# Patient Record
Sex: Female | Born: 1995 | Race: White | Hispanic: No | Marital: Single | Smoking: Former smoker
Health system: Southern US, Community
[De-identification: ages and names within clinical notes are randomized; demographics above are authoritative.]

## PROBLEM LIST (undated history)

## (undated) DIAGNOSIS — O24419 Gestational diabetes mellitus in pregnancy, unspecified control: Secondary | ICD-10-CM

## (undated) DIAGNOSIS — A609 Anogenital herpesviral infection, unspecified: Secondary | ICD-10-CM

## (undated) HISTORY — PX: NO PAST SURGERIES: SHX2092

---

## 2016-07-04 ENCOUNTER — Encounter (HOSPITAL_COMMUNITY): Payer: Self-pay | Admitting: Emergency Medicine

## 2016-07-04 ENCOUNTER — Emergency Department (HOSPITAL_COMMUNITY): Payer: Self-pay

## 2016-07-04 ENCOUNTER — Emergency Department (HOSPITAL_COMMUNITY)
Admission: EM | Admit: 2016-07-04 | Discharge: 2016-07-04 | Disposition: A | Discharge status: Home / Self Care | Payer: Self-pay | Attending: Emergency Medicine | Admitting: Emergency Medicine

## 2016-07-04 DIAGNOSIS — Y929 Unspecified place or not applicable: Secondary | ICD-10-CM | POA: Insufficient documentation

## 2016-07-04 DIAGNOSIS — Y999 Unspecified external cause status: Secondary | ICD-10-CM | POA: Insufficient documentation

## 2016-07-04 DIAGNOSIS — S93401A Sprain of unspecified ligament of right ankle, initial encounter: Secondary | ICD-10-CM | POA: Insufficient documentation

## 2016-07-04 DIAGNOSIS — W109XXA Fall (on) (from) unspecified stairs and steps, initial encounter: Secondary | ICD-10-CM | POA: Insufficient documentation

## 2016-07-04 DIAGNOSIS — F17200 Nicotine dependence, unspecified, uncomplicated: Secondary | ICD-10-CM | POA: Insufficient documentation

## 2016-07-04 DIAGNOSIS — Y9301 Activity, walking, marching and hiking: Secondary | ICD-10-CM | POA: Insufficient documentation

## 2016-07-04 MED ORDER — NAPROXEN 250 MG PO TABS
500.0000 mg | ORAL_TABLET | Freq: Once | ORAL | Status: AC
  Administered 2016-07-04: 500 mg via ORAL
  Filled 2016-07-04: qty 2

## 2016-07-04 NOTE — Discharge Instructions (Signed)
Please continue to use the Ace bandage and crutches given to here in ED as needed. Weightbearing as tolerated. Please use ibuprofen or naproxen as needed for pain and swelling. Please use rest, ice, compression, elevation at home. Please follow-up with Dr. Rayburn Ma at [facility], in 1-2 weeks as needed or if symptoms worsen.   Contact a health care provider if: You have rapidly increasing bruising or swelling. Your pain is not relieved with medicine. Get help right away if: Your toes or foot becomes numb or blue. You have severe pain that gets worse.

## 2016-07-04 NOTE — ED Provider Notes (Signed)
[facility]-EMERGENCY DEPT Provider Note   CSN: [id number] Arrival date & time: 07/04/16  2048  By signing my name below, I, Nelwyn Salisbury, attest that this documentation has been prepared under the direction and in the presence of non-physician practitioner, Candie Mile, PA-C. Electronically Signed: Nelwyn Salisbury, Scribe. 07/04/2016. 10:08 PM.  History   Chief Complaint Chief Complaint  Patient presents with  . Foot Pain   The history is provided by the patient. No language interpreter was used.   HPI Comments:  Rebecca Rangel is an otherwise healthy 21 y.o. female who presents to the Emergency Department complaining of constant, unchanged right ankle pain after falling last night. She describes her symptoms as a 9/10 aching pain exacerbated with weight-bearing. Pt states that she was walking down some stairs when her ankle inverted. She reports associated swelling to the area. Pt has not tried any medications PTA for her symptoms. Denies any headache, nausea, vomiting or fever.    History reviewed. No pertinent past medical history.  There are no active problems to display for this patient.   History reviewed. No pertinent surgical history.  OB History    No data available       Home Medications    Prior to Admission medications   Not on File    Family History History reviewed. No pertinent family history.  Social History Social History  Substance Use Topics  . Smoking status: Current Every Day Smoker    Packs/day: 0.25  . Smokeless tobacco: Never Used  . Alcohol use Yes     Comment: socially     Allergies   Patient has no allergy information on record.   Review of Systems Review of Systems  Constitutional: Negative for fever.  Gastrointestinal: Negative for nausea and vomiting.  Musculoskeletal: Positive for arthralgias and joint swelling.  Neurological: Negative for headaches.     Physical Exam Updated Vital Signs BP 94/82 (BP Location: Right Arm)    Pulse (!) 102   Temp 98.9 F (37.2 C) (Oral)   Resp 18   Ht  (1.651 m)   LMP 06/03/2016   SpO2 99%   Physical Exam  Constitutional: She appears well-developed and well-nourished.  Well appearing  HENT:  Head: Normocephalic and atraumatic.  Nose: Nose normal.  Eyes: Conjunctivae and EOM are normal.  Neck: Normal range of motion.  Cardiovascular: Normal rate and intact distal pulses.   Pulmonary/Chest: Effort normal. No respiratory distress.  Normal work of breathing. No respiratory distress noted.   Abdominal: Soft.  Musculoskeletal: Normal range of motion. She exhibits tenderness.  DP pulses 2+ bilaterally. Good strength and sensation distally. Mild swelling to lateral malleolus of right foot. No redness or bruising. Mildly tender to palpation.  Neurological: She is alert.  Skin: Skin is warm.  Psychiatric: She has a normal mood and affect. Her behavior is normal.  Nursing note and vitals reviewed.    ED Treatments / Results  DIAGNOSTIC STUDIES:  Oxygen Saturation is 99% on RA, normal by my interpretation.    COORDINATION OF CARE:  11:03 PM Discussed treatment plan with pt at bedside and pt agreed to plan.  Labs (all labs ordered are listed, but only abnormal results are displayed) Labs Reviewed - No data to display  EKG  EKG Interpretation None       Radiology Dg Ankle Complete Right  Result Date: 07/04/2016 CLINICAL DATA:  Fall with ankle pain. EXAM: RIGHT ANKLE - COMPLETE 3+ VIEW COMPARISON:  None. FINDINGS: There is no  evidence of fracture, dislocation, or joint effusion. There is no evidence of arthropathy or other focal bone abnormality. Moderate soft tissue swelling. IMPRESSION: No fracture or dislocation of the right ankle. Electronically Signed   By: Deatra Robinson M.D.   On: 07/04/2016 22:05    Procedures Procedures (including critical care time)  Medications Ordered in ED Medications  naproxen (NAPROSYN) tablet 500 mg (not administered)      Initial Impression / Assessment and Plan / ED Course  I have reviewed the triage vital signs and the nursing notes.  Pertinent labs & imaging results that were available during my care of the patient were reviewed by me and considered in my medical decision making (see chart for details).    Patient X-Ray negative for obvious fracture or dislocation. Pain managed in ED. Pt advised to follow up with orthopedics if symptoms persist for possibility of missed fracture diagnosis in 1-2 weeks. Patient given ace bandage, crutches, and naproxen while in ED, conservative therapy recommended and discussed. Patient in NAD, hemodynamically stable, afebrile. Patient will be dc home & is agreeable with above plan. Return precautions given.   Final Clinical Impressions(s) / ED Diagnoses   Final diagnoses:  Sprain of right ankle, unspecified ligament, initial encounter    New Prescriptions New Prescriptions   No medications on file  I personally performed the services described in this documentation, which was scribed in my presence. The recorded information has been reviewed and is accurate.    [address] [place], [place] 07/04/16 2305    Jacalyn Lefevre, MD 07/04/16 [phone]

## 2016-07-04 NOTE — ED Triage Notes (Signed)
Pt presents to ED for assessment of right foot pain after twisting it coming down steps.  Pt c/o pain radiating up from foot to knee.

## 2016-07-04 NOTE — ED Notes (Signed)
ED Provider at bedside. 

## 2016-07-04 NOTE — Progress Notes (Signed)
Orthopedic Tech Progress Note Patient Details:  Rebecca Rangel 1995-09-11 [id number]  Ortho Devices Type of Ortho Device: Ace wrap, Crutches Ortho Device/Splint Location: RLE Ortho Device/Splint Interventions: Ordered, Application   Jennye Moccasin 07/04/2016, 11:07 PM

## 2018-01-06 LAB — OB RESULTS CONSOLE ABO/RH: RH Type: NEGATIVE

## 2018-01-06 LAB — OB RESULTS CONSOLE GC/CHLAMYDIA
Chlamydia: NEGATIVE
Gonorrhea: NEGATIVE

## 2018-01-06 LAB — OB RESULTS CONSOLE HIV ANTIBODY (ROUTINE TESTING): HIV: NONREACTIVE

## 2018-01-06 LAB — OB RESULTS CONSOLE HEPATITIS B SURFACE ANTIGEN: Hepatitis B Surface Ag: NEGATIVE

## 2018-01-06 LAB — OB RESULTS CONSOLE RUBELLA ANTIBODY, IGM: Rubella: IMMUNE

## 2018-01-06 LAB — OB RESULTS CONSOLE RPR: RPR: NONREACTIVE

## 2018-01-06 LAB — OB RESULTS CONSOLE ANTIBODY SCREEN: Antibody Screen: NEGATIVE

## 2018-06-08 ENCOUNTER — Other Ambulatory Visit: Payer: Self-pay

## 2018-06-08 ENCOUNTER — Encounter: Payer: Self-pay | Attending: Obstetrics and Gynecology | Admitting: Registered"

## 2018-06-08 DIAGNOSIS — O99810 Abnormal glucose complicating pregnancy: Secondary | ICD-10-CM | POA: Insufficient documentation

## 2018-06-10 ENCOUNTER — Encounter: Payer: Self-pay | Admitting: Registered"

## 2018-06-10 DIAGNOSIS — O99810 Abnormal glucose complicating pregnancy: Secondary | ICD-10-CM | POA: Insufficient documentation

## 2018-06-10 NOTE — Progress Notes (Signed)
Patient was seen on 06/08/18 for Gestational Diabetes self-management class at the [facility]. The following learning objectives were met by the patient during this course:   States the definition of Gestational Diabetes  States why dietary management is important in controlling blood glucose  Describes the effects each nutrient has on blood glucose levels  Demonstrates ability to create a balanced meal plan  Demonstrates carbohydrate counting   States when to check blood glucose levels  Demonstrates proper blood glucose monitoring techniques  States the effect of stress and exercise on blood glucose levels  States the importance of limiting caffeine and abstaining from alcohol and smoking  Blood glucose monitor given: [employer] Lot # [id number] x Exp: 11/21/18 Blood glucose reading: 85 mg/dL  Patient instructed to monitor glucose levels: FBS: 60 - <95; 1 hour: <140; 2 hour: <120  Patient received handouts:  Nutrition Diabetes and Pregnancy, including carb counting list  Patient will be seen for follow-up as needed.

## 2018-07-04 IMAGING — CR DG ANKLE COMPLETE 3+V*R*
3 series · 3 of 3 positions shown · non-contrast
Comparison: None.

CLINICAL DATA: Fall with ankle pain.

EXAM:
RIGHT ANKLE - COMPLETE 3+ VIEW

[ankle ap]
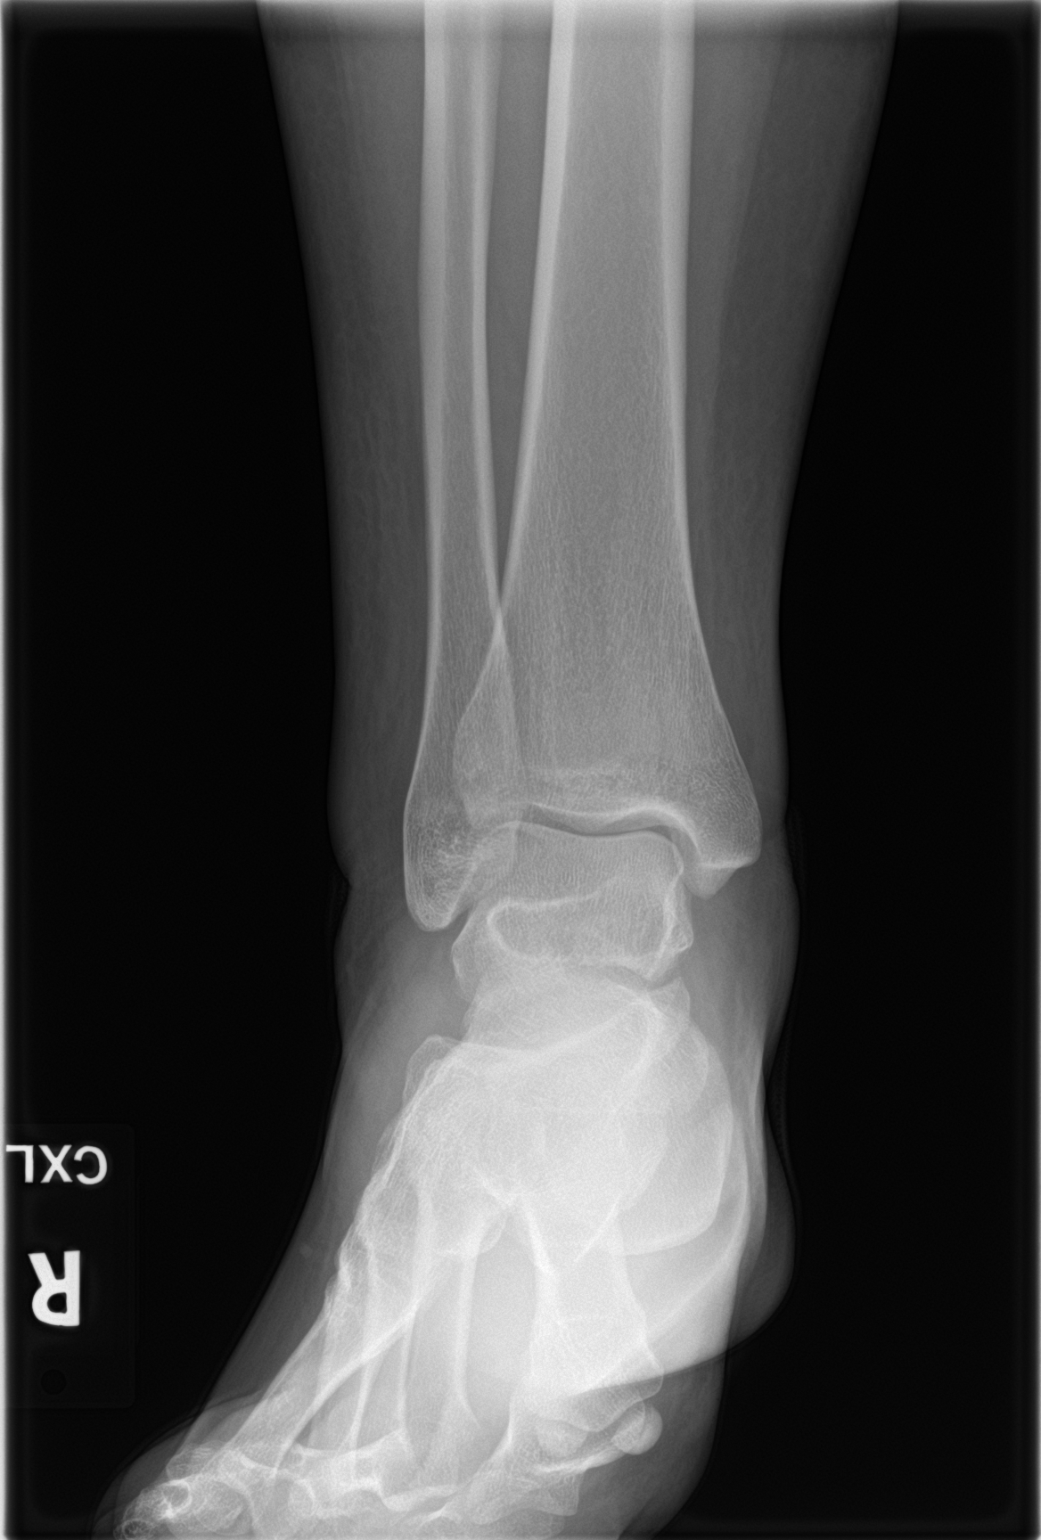

[ankle obl]
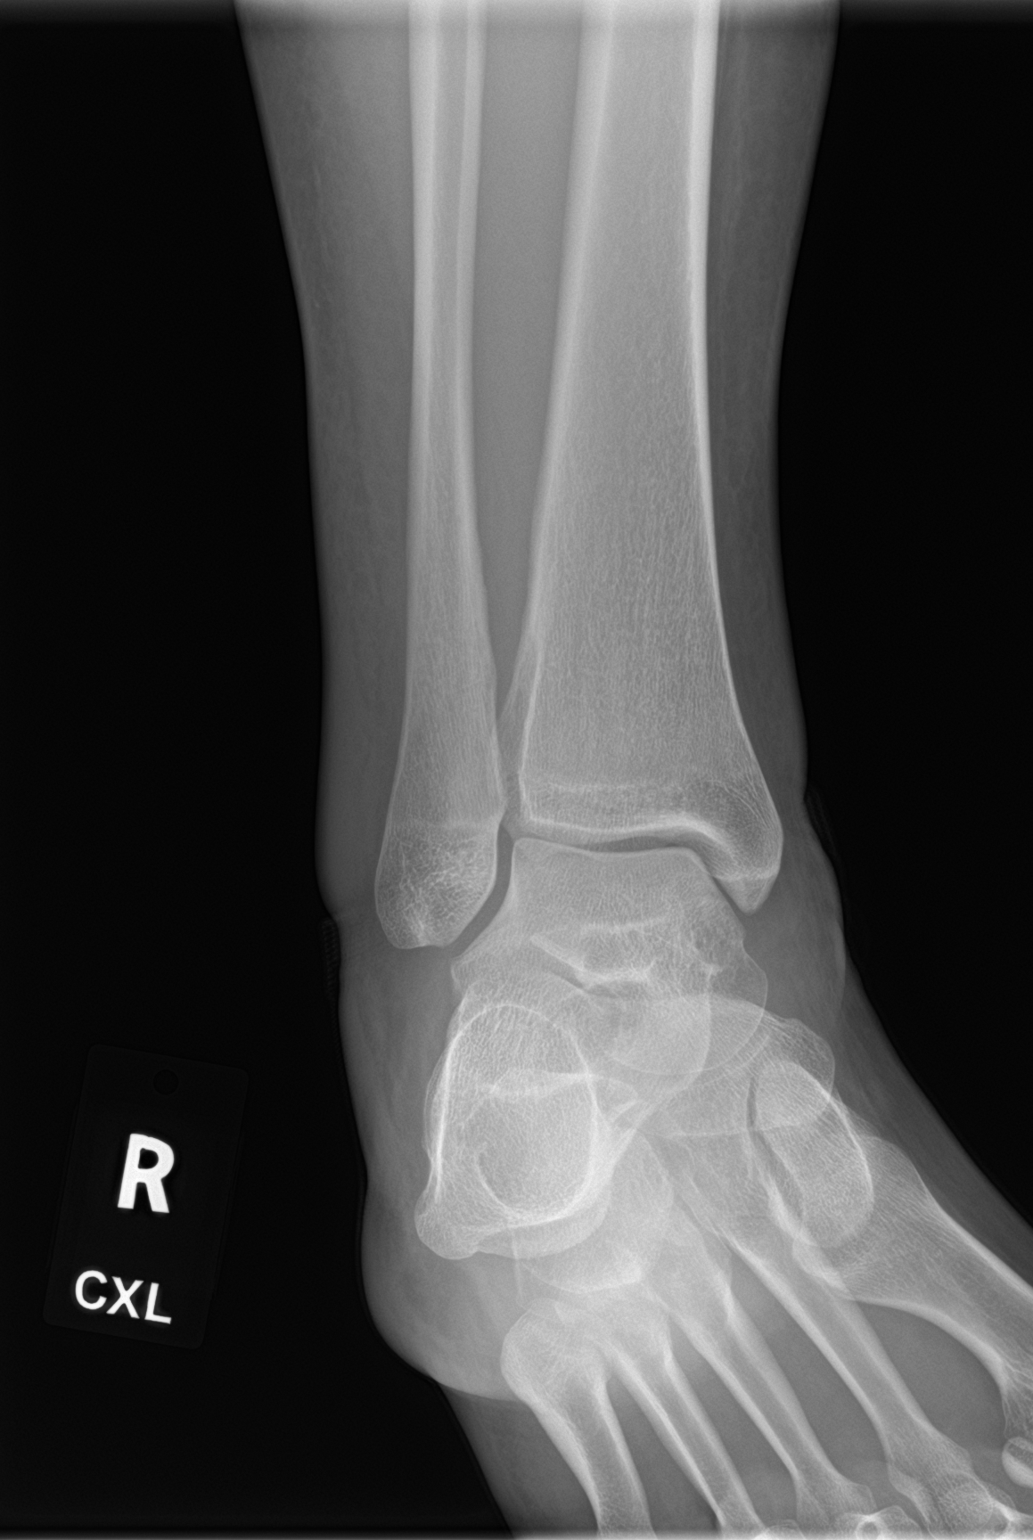

[ankle lat]
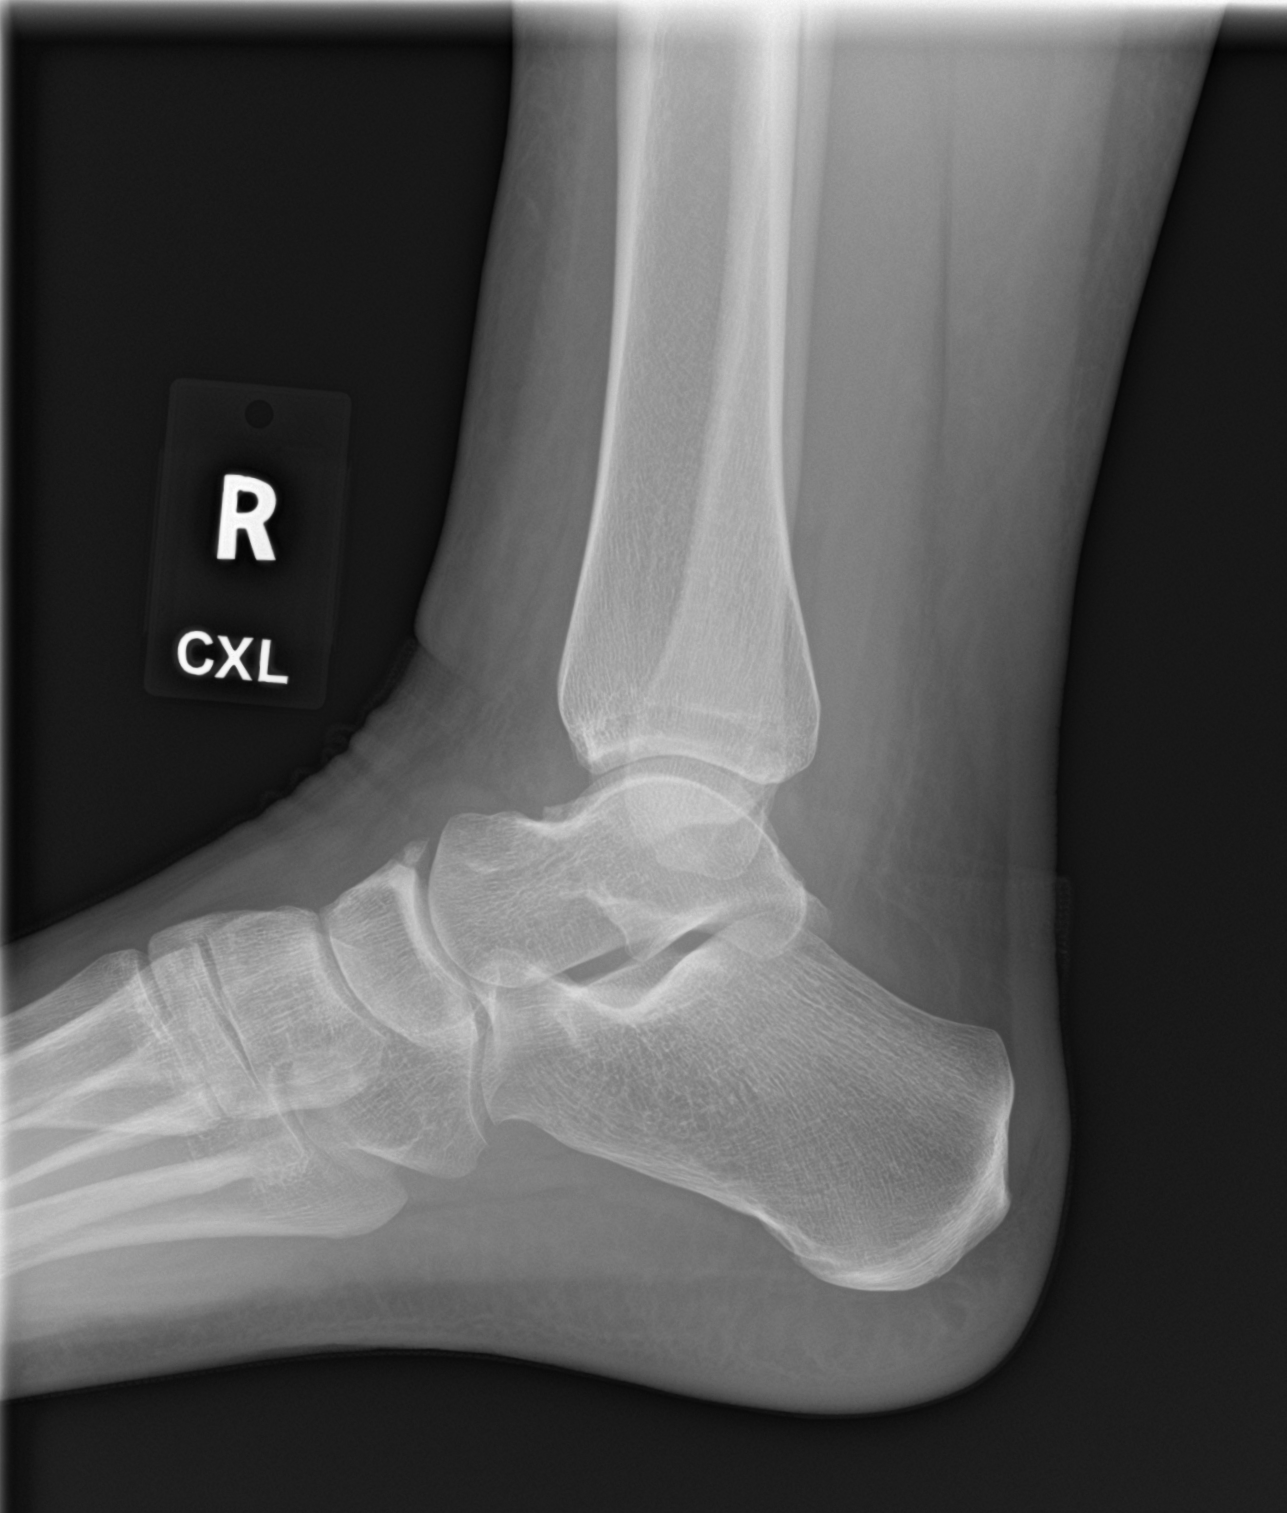

[3 of 3 positions shown; findings below may reference images not displayed]

FINDINGS: There is no evidence of fracture, dislocation, or joint effusion.
There is no evidence of arthropathy or other focal bone abnormality.
Moderate soft tissue swelling.
IMPRESSION: No fracture or dislocation of the right ankle.

## 2018-07-25 ENCOUNTER — Encounter (HOSPITAL_COMMUNITY): Payer: Self-pay | Admitting: *Deleted

## 2018-07-25 NOTE — Patient Instructions (Signed)
Denese Deshane  07/25/2018   Your procedure is scheduled on:  08/09/2018  Arrive at 0500 at [employer] on [address] at [facility]  and [employer]. You are invited to use the FREE valet parking or use the Visitor's parking deck.  Pick up the phone at the desk and dial [phone].  Call this number if you have problems the morning of surgery: [phone]  Remember:   Do not eat food:(After Midnight) Desps de medianoche.  Do not drink clear liquids: (After Midnight) Desps de medianoche.  Take these medicines the morning of surgery with A SIP OF WATER:  Do not take metformin the night before surgery.  Take valtrex as prescribed.   Do not wear jewelry, make-up or nail polish.  Do not wear lotions, powders, or perfumes. Do not wear deodorant.  Do not shave 48 hours prior to surgery.  Do not bring valuables to the hospital.  [facility] is not   responsible for any belongings or valuables brought to the hospital.  Contacts, dentures or bridgework may not be worn into surgery.  Leave suitcase in the car. After surgery it may be brought to your room.  For patients admitted to the hospital, checkout time is 11:00 AM the day of              discharge.      Please read over the following fact sheets that you were given:     Preparing for Surgery

## 2018-08-03 ENCOUNTER — Encounter (HOSPITAL_COMMUNITY)
Admission: AD | Disposition: A | Discharge status: Home / Self Care | Payer: Self-pay | Source: Home / Self Care | Attending: Obstetrics and Gynecology

## 2018-08-03 ENCOUNTER — Inpatient Hospital Stay (HOSPITAL_COMMUNITY): Payer: BLUE CROSS/BLUE SHIELD | Admitting: Anesthesiology

## 2018-08-03 ENCOUNTER — Inpatient Hospital Stay (HOSPITAL_COMMUNITY)
Admission: AD | Admit: 2018-08-03 | Discharge: 2018-08-06 | DRG: 787 | Disposition: A | Discharge status: Home / Self Care | Payer: BLUE CROSS/BLUE SHIELD | Attending: Obstetrics and Gynecology | Admitting: Obstetrics and Gynecology

## 2018-08-03 ENCOUNTER — Other Ambulatory Visit: Payer: Self-pay

## 2018-08-03 ENCOUNTER — Encounter (HOSPITAL_COMMUNITY): Payer: Self-pay | Admitting: *Deleted

## 2018-08-03 DIAGNOSIS — Z1159 Encounter for screening for other viral diseases: Secondary | ICD-10-CM | POA: Diagnosis not present

## 2018-08-03 DIAGNOSIS — Z6791 Unspecified blood type, Rh negative: Secondary | ICD-10-CM

## 2018-08-03 DIAGNOSIS — O26893 Other specified pregnancy related conditions, third trimester: Secondary | ICD-10-CM | POA: Diagnosis present

## 2018-08-03 DIAGNOSIS — Z87891 Personal history of nicotine dependence: Secondary | ICD-10-CM

## 2018-08-03 DIAGNOSIS — O9832 Other infections with a predominantly sexual mode of transmission complicating childbirth: Secondary | ICD-10-CM | POA: Diagnosis present

## 2018-08-03 DIAGNOSIS — O24425 Gestational diabetes mellitus in childbirth, controlled by oral hypoglycemic drugs: Secondary | ICD-10-CM | POA: Diagnosis present

## 2018-08-03 DIAGNOSIS — Z98891 History of uterine scar from previous surgery: Secondary | ICD-10-CM

## 2018-08-03 DIAGNOSIS — Z3A38 38 weeks gestation of pregnancy: Secondary | ICD-10-CM | POA: Diagnosis not present

## 2018-08-03 DIAGNOSIS — A6000 Herpesviral infection of urogenital system, unspecified: Secondary | ICD-10-CM | POA: Diagnosis present

## 2018-08-03 DIAGNOSIS — O321XX0 Maternal care for breech presentation, not applicable or unspecified: Principal | ICD-10-CM | POA: Diagnosis present

## 2018-08-03 LAB — SARS CORONAVIRUS 2 BY RT PCR (HOSPITAL ORDER, PERFORMED IN ~~LOC~~ HOSPITAL LAB): SARS Coronavirus 2: NEGATIVE

## 2018-08-03 LAB — TYPE AND SCREEN
ABO/RH(D): O NEG
Antibody Screen: NEGATIVE

## 2018-08-03 LAB — CBC
HCT: 39.4 % (ref 36.0–46.0)
Hemoglobin: 13.3 g/dL (ref 12.0–15.0)
MCH: 29.4 pg (ref 26.0–34.0)
MCHC: 33.8 g/dL (ref 30.0–36.0)
MCV: 87.2 fL (ref 80.0–100.0)
Platelets: 261 10*3/uL (ref 150–400)
RBC: 4.52 MIL/uL (ref 3.87–5.11)
RDW: 13.9 % (ref 11.5–15.5)
WBC: 15.3 10*3/uL — ABNORMAL HIGH (ref 4.0–10.5)
nRBC: 0 % (ref 0.0–0.2)

## 2018-08-03 LAB — GLUCOSE, CAPILLARY: Glucose-Capillary: 85 mg/dL (ref 70–99)

## 2018-08-03 LAB — ABO/RH: ABO/RH(D): O NEG

## 2018-08-03 LAB — RPR: RPR Ser Ql: NONREACTIVE

## 2018-08-03 SURGERY — Surgical Case
Anesthesia: Spinal | Wound class: Clean Contaminated

## 2018-08-03 MED ORDER — OXYTOCIN 40 UNITS IN NORMAL SALINE INFUSION - SIMPLE MED: 2.5000 [IU]/h | INTRAVENOUS | Status: AC

## 2018-08-03 MED ORDER — SIMETHICONE 80 MG PO CHEW: 80.0000 mg | CHEWABLE_TABLET | ORAL | Status: DC | PRN

## 2018-08-03 MED ORDER — MORPHINE SULFATE (PF) 0.5 MG/ML IJ SOLN
INTRAMUSCULAR | Status: DC | PRN
  Administered 2018-08-03: .15 mg via INTRATHECAL

## 2018-08-03 MED ORDER — MENTHOL 3 MG MT LOZG: 1.0000 | LOZENGE | OROMUCOSAL | Status: DC | PRN

## 2018-08-03 MED ORDER — BUPIVACAINE IN DEXTROSE 0.75-8.25 % IT SOLN
INTRATHECAL | Status: DC | PRN
  Administered 2018-08-03: 1.6 mg via INTRATHECAL

## 2018-08-03 MED ORDER — OXYCODONE HCL 5 MG/5ML PO SOLN: 5.0000 mg | Freq: Once | ORAL | Status: DC | PRN

## 2018-08-03 MED ORDER — PHENYLEPHRINE HCL-NACL 20-0.9 MG/250ML-% IV SOLN
INTRAVENOUS | Status: DC | PRN
  Administered 2018-08-03: 60 ug/min via INTRAVENOUS

## 2018-08-03 MED ORDER — CARBOPROST TROMETHAMINE 250 MCG/ML IM SOLN
INTRAMUSCULAR | Status: AC
  Filled 2018-08-03: qty 1

## 2018-08-03 MED ORDER — IBUPROFEN 800 MG PO TABS
800.0000 mg | ORAL_TABLET | Freq: Three times a day (TID) | ORAL | Status: AC
  Administered 2018-08-03 – 2018-08-06 (×9): 800 mg via ORAL
  Filled 2018-08-03 (×9): qty 1

## 2018-08-03 MED ORDER — NALBUPHINE HCL 10 MG/ML IJ SOLN: 5.0000 mg | Freq: Once | INTRAMUSCULAR | Status: DC | PRN

## 2018-08-03 MED ORDER — WITCH HAZEL-GLYCERIN EX PADS: 1.0000 "application " | MEDICATED_PAD | CUTANEOUS | Status: DC | PRN

## 2018-08-03 MED ORDER — LACTATED RINGERS IV SOLN
INTRAVENOUS | Status: DC
  Administered 2018-08-03 (×2): via INTRAVENOUS

## 2018-08-03 MED ORDER — ZOLPIDEM TARTRATE 5 MG PO TABS: 5.0000 mg | ORAL_TABLET | Freq: Every evening | ORAL | Status: DC | PRN

## 2018-08-03 MED ORDER — ONDANSETRON HCL 4 MG/2ML IJ SOLN: 4.0000 mg | Freq: Once | INTRAMUSCULAR | Status: DC | PRN

## 2018-08-03 MED ORDER — ONDANSETRON HCL 4 MG/2ML IJ SOLN: 4.0000 mg | Freq: Three times a day (TID) | INTRAMUSCULAR | Status: DC | PRN

## 2018-08-03 MED ORDER — OXYCODONE HCL 5 MG PO TABS: 5.0000 mg | ORAL_TABLET | ORAL | Status: DC | PRN

## 2018-08-03 MED ORDER — FENTANYL CITRATE (PF) 100 MCG/2ML IJ SOLN
INTRAMUSCULAR | Status: AC
  Filled 2018-08-03: qty 2

## 2018-08-03 MED ORDER — FENTANYL CITRATE (PF) 100 MCG/2ML IJ SOLN
25.0000 ug | INTRAMUSCULAR | Status: DC | PRN
  Administered 2018-08-03: 50 ug via INTRAVENOUS

## 2018-08-03 MED ORDER — SIMETHICONE 80 MG PO CHEW
80.0000 mg | CHEWABLE_TABLET | ORAL | Status: DC
  Administered 2018-08-03 – 2018-08-04 (×2): 80 mg via ORAL
  Filled 2018-08-03 (×3): qty 1

## 2018-08-03 MED ORDER — CEFAZOLIN SODIUM-DEXTROSE 2-3 GM-%(50ML) IV SOLR
INTRAVENOUS | Status: DC | PRN
  Administered 2018-08-03: 2 g via INTRAVENOUS

## 2018-08-03 MED ORDER — CEFAZOLIN SODIUM-DEXTROSE 2-4 GM/100ML-% IV SOLN
2.0000 g | INTRAVENOUS | Status: DC
  Filled 2018-08-03: qty 100

## 2018-08-03 MED ORDER — DIPHENHYDRAMINE HCL 25 MG PO CAPS: 25.0000 mg | ORAL_CAPSULE | Freq: Four times a day (QID) | ORAL | Status: DC | PRN

## 2018-08-03 MED ORDER — NALBUPHINE HCL 10 MG/ML IJ SOLN: 5.0000 mg | INTRAMUSCULAR | Status: DC | PRN

## 2018-08-03 MED ORDER — SODIUM CHLORIDE 0.9% FLUSH: 3.0000 mL | INTRAVENOUS | Status: DC | PRN

## 2018-08-03 MED ORDER — FENTANYL CITRATE (PF) 100 MCG/2ML IJ SOLN
INTRAMUSCULAR | Status: DC | PRN
  Administered 2018-08-03: 15 ug via INTRATHECAL

## 2018-08-03 MED ORDER — DIBUCAINE (PERIANAL) 1 % EX OINT: 1.0000 "application " | TOPICAL_OINTMENT | CUTANEOUS | Status: DC | PRN

## 2018-08-03 MED ORDER — LACTATED RINGERS IV SOLN
INTRAVENOUS | Status: DC
  Administered 2018-08-03: 09:00:00 via INTRAVENOUS

## 2018-08-03 MED ORDER — SODIUM CHLORIDE 0.9 % IV SOLN
INTRAVENOUS | Status: DC | PRN
  Administered 2018-08-03: 10:00:00 via INTRAVENOUS

## 2018-08-03 MED ORDER — SENNOSIDES-DOCUSATE SODIUM 8.6-50 MG PO TABS
2.0000 | ORAL_TABLET | ORAL | Status: DC
  Administered 2018-08-03 – 2018-08-05 (×3): 2 via ORAL
  Filled 2018-08-03 (×3): qty 2

## 2018-08-03 MED ORDER — DIPHENHYDRAMINE HCL 50 MG/ML IJ SOLN: 12.5000 mg | INTRAMUSCULAR | Status: DC | PRN

## 2018-08-03 MED ORDER — SODIUM CHLORIDE 0.9 % IV SOLN
INTRAVENOUS | Status: DC | PRN
  Administered 2018-08-03: 10:00:00 40 [IU] via INTRAVENOUS

## 2018-08-03 MED ORDER — ONDANSETRON HCL 4 MG/2ML IJ SOLN
INTRAMUSCULAR | Status: AC
  Filled 2018-08-03: qty 2

## 2018-08-03 MED ORDER — COCONUT OIL OIL: 1.0000 "application " | TOPICAL_OIL | Status: DC | PRN

## 2018-08-03 MED ORDER — OXYCODONE HCL 5 MG PO TABS: 5.0000 mg | ORAL_TABLET | Freq: Once | ORAL | Status: DC | PRN

## 2018-08-03 MED ORDER — FAMOTIDINE IN NACL 20-0.9 MG/50ML-% IV SOLN
20.0000 mg | Freq: Once | INTRAVENOUS | Status: AC
  Administered 2018-08-03: 20 mg via INTRAVENOUS
  Filled 2018-08-03: qty 50

## 2018-08-03 MED ORDER — NALOXONE HCL 0.4 MG/ML IJ SOLN: 0.4000 mg | INTRAMUSCULAR | Status: DC | PRN

## 2018-08-03 MED ORDER — PRENATAL MULTIVITAMIN CH
1.0000 | ORAL_TABLET | Freq: Every day | ORAL | Status: DC
  Administered 2018-08-03 – 2018-08-05 (×3): 1 via ORAL
  Filled 2018-08-03 (×3): qty 1

## 2018-08-03 MED ORDER — SCOPOLAMINE 1 MG/3DAYS TD PT72: 1.0000 | MEDICATED_PATCH | Freq: Once | TRANSDERMAL | Status: DC

## 2018-08-03 MED ORDER — DIPHENHYDRAMINE HCL 25 MG PO CAPS: 25.0000 mg | ORAL_CAPSULE | ORAL | Status: DC | PRN

## 2018-08-03 MED ORDER — OXYTOCIN 10 UNIT/ML IJ SOLN
INTRAMUSCULAR | Status: AC
  Filled 2018-08-03: qty 4

## 2018-08-03 MED ORDER — MORPHINE SULFATE (PF) 0.5 MG/ML IJ SOLN
INTRAMUSCULAR | Status: AC
  Filled 2018-08-03: qty 10

## 2018-08-03 MED ORDER — SIMETHICONE 80 MG PO CHEW
80.0000 mg | CHEWABLE_TABLET | Freq: Three times a day (TID) | ORAL | Status: DC
  Administered 2018-08-03 – 2018-08-06 (×8): 80 mg via ORAL
  Filled 2018-08-03 (×8): qty 1

## 2018-08-03 MED ORDER — PHENYLEPHRINE HCL-NACL 20-0.9 MG/250ML-% IV SOLN
INTRAVENOUS | Status: AC
  Filled 2018-08-03: qty 250

## 2018-08-03 MED ORDER — SOD CITRATE-CITRIC ACID 500-334 MG/5ML PO SOLN
30.0000 mL | Freq: Once | ORAL | Status: AC
  Administered 2018-08-03: 30 mL via ORAL
  Filled 2018-08-03: qty 30

## 2018-08-03 MED ORDER — LACTATED RINGERS IV SOLN
INTRAVENOUS | Status: DC | PRN
  Administered 2018-08-03 (×2): via INTRAVENOUS

## 2018-08-03 MED ORDER — NALOXONE HCL 4 MG/10ML IJ SOLN
1.0000 ug/kg/h | INTRAVENOUS | Status: DC | PRN
  Filled 2018-08-03: qty 5

## 2018-08-03 MED ORDER — OXYTOCIN 40 UNITS IN NORMAL SALINE INFUSION - SIMPLE MED
INTRAVENOUS | Status: AC
  Filled 2018-08-03: qty 1000

## 2018-08-03 MED ORDER — ONDANSETRON HCL 4 MG/2ML IJ SOLN
INTRAMUSCULAR | Status: DC | PRN
  Administered 2018-08-03: 4 mg via INTRAVENOUS

## 2018-08-03 SURGICAL SUPPLY — 35 items
BENZOIN TINCTURE PRP APPL 2/3 (GAUZE/BANDAGES/DRESSINGS) ×3 IMPLANT
CHLORAPREP W/TINT 26ML (MISCELLANEOUS) ×3 IMPLANT
CLAMP CORD UMBIL (MISCELLANEOUS) IMPLANT
CLOSURE STERI-STRIP 1/2X4 (GAUZE/BANDAGES/DRESSINGS) ×1
CLOTH BEACON ORANGE TIMEOUT ST (SAFETY) ×3 IMPLANT
CLSR STERI-STRIP ANTIMIC 1/2X4 (GAUZE/BANDAGES/DRESSINGS) ×2 IMPLANT
DRSG OPSITE POSTOP 4X10 (GAUZE/BANDAGES/DRESSINGS) ×3 IMPLANT
ELECT REM PT RETURN 9FT ADLT (ELECTROSURGICAL) ×3
ELECTRODE REM PT RTRN 9FT ADLT (ELECTROSURGICAL) ×1 IMPLANT
EXTRACTOR VACUUM KIWI (MISCELLANEOUS) IMPLANT
EXTRACTOR VACUUM M CUP 4 TUBE (SUCTIONS) IMPLANT
EXTRACTOR VACUUM M CUP 4' TUBE (SUCTIONS)
GLOVE BIOGEL PI IND STRL 7.0 (GLOVE) ×1 IMPLANT
GLOVE BIOGEL PI INDICATOR 7.0 (GLOVE) ×2
GLOVE ORTHO TXT STRL SZ7.5 (GLOVE) ×3 IMPLANT
GOWN STRL REUS W/TWL LRG LVL3 (GOWN DISPOSABLE) ×6 IMPLANT
KIT ABG SYR 3ML LUER SLIP (SYRINGE) IMPLANT
NEEDLE HYPO 25X5/8 SAFETYGLIDE (NEEDLE) ×3 IMPLANT
NS IRRIG 1000ML POUR BTL (IV SOLUTION) ×3 IMPLANT
PACK C SECTION WH (CUSTOM PROCEDURE TRAY) ×3 IMPLANT
PAD OB MATERNITY 4.3X12.25 (PERSONAL CARE ITEMS) ×3 IMPLANT
PENCIL SMOKE EVAC W/HOLSTER (ELECTROSURGICAL) ×3 IMPLANT
RTRCTR C-SECT PINK 25CM LRG (MISCELLANEOUS) ×3 IMPLANT
SUT CHROMIC 1 CTX 36 (SUTURE) ×6 IMPLANT
SUT PLAIN 0 NONE (SUTURE) IMPLANT
SUT PLAIN 2 0 XLH (SUTURE) IMPLANT
SUT VIC AB 0 CT1 27 (SUTURE) ×4
SUT VIC AB 0 CT1 27XBRD ANBCTR (SUTURE) ×2 IMPLANT
SUT VIC AB 2-0 CT1 (SUTURE) ×3 IMPLANT
SUT VIC AB 2-0 CT1 27 (SUTURE) ×2
SUT VIC AB 2-0 CT1 TAPERPNT 27 (SUTURE) ×1 IMPLANT
SUT VIC AB 4-0 KS 27 (SUTURE) IMPLANT
TOWEL OR 17X24 6PK STRL BLUE (TOWEL DISPOSABLE) ×3 IMPLANT
TRAY FOLEY W/BAG SLVR 14FR LF (SET/KITS/TRAYS/PACK) ×3 IMPLANT
WATER STERILE IRR 1000ML POUR (IV SOLUTION) ×3 IMPLANT

## 2018-08-03 NOTE — MAU Note (Signed)
Called [facility] to notify of prep for C-section.

## 2018-08-03 NOTE — Progress Notes (Signed)
Patient ID: Rebecca Rangel, female   DOB: 1995-10-19, 24 y.o.   MRN: [id number]  D/w pt r/b/a of LTCS  Bedside US confirms breech

## 2018-08-03 NOTE — Op Note (Signed)
Rebecca Rangel, Rebecca Rangel MEDICAL RECORD [id number] ACCOUNT [id number] DATE OF BIRTH:09/05/1995 FACILITY: [facility] LOCATION: [facility] PHYSICIAN:Stevan Eberwein BOVARD-STUCKERT, MD  OPERATIVE REPORT  DATE OF PROCEDURE:  08/03/2018  PREOPERATIVE DIAGNOSES:  Intrauterine pregnancy at term, breech presentation and rupture of membranes.  POSTOPERATIVE DIAGNOSES:  Intrauterine pregnancy at term, breech presentation and rupture of membranes, delivered.  PROCEDURE:  Primary low transverse cesarean section.  SURGEON:  Sherron Monday, M.D.  ANESTHESIA:  Spinal.  ESTIMATED BLOOD LOSS:  Approximately 487 mL.  URINE OUTPUT AND IV FLUID:  Per Anesthesia.  Clear urine at the end of the procedure.  FINDINGS:  Viable female infant at 9:39 a.m. with Apgars of 8 at 1 minute and 9 at 5 minutes and a weight pending at the time of dictation.  Normal uterus, tubes and ovaries are noted.  COMPLICATIONS:  None.  PATHOLOGY:  Placenta to labor and delivery.  DESCRIPTION OF PROCEDURE:  After informed consent was reviewed with the patient including risks, benefits and alternatives of the surgical procedure.  She was transported to the operating room where spinal anesthesia was placed and found to be adequate.   She was then returned to the supine position with a leftward tilt.  An appropriate timeout was performed.  She was prepped and draped in the normal sterile fashion.  After confirmation that her spinal was an adequate level, a Pfannenstiel incision was  made and carried through to the underlying layer of fascia sharply.  The fascia was incised in the midline and the incision extended laterally with Mayo scissors.  Superior aspect of the fascial incision was grasped with Kocher clamps, elevated and the  rectus muscles were dissected off both bluntly and sharply.  Attention was turned to assessing the midline which was easily identified and the peritoneum was entered bluntly.  The incision was extended superiorly and  inferiorly with good visualization of  the bladder.  An Alexis skin retractor was placed carefully making sure that no bowel was entrapped.  Uterus was incised in a transverse fashion.  Infant was delivered from a frank breech presentation in a typical fashion.  A nuchal cord was noted.  The  placenta was expressed.  Uterus was cleared of all clot and debris.  After delivery of the infant, she was dried and stimulated.  After a minute, the cord was clamped and cut and infant was handed off to the waiting pediatric staff.  The uterus  was  cleared of all clot and debris and the incision was closed with 2 layers of 0 Monocryl, the first of which was running locked and the second was an imbricating layer.  The gutters were cleared of all clot and debris.  The peritoneum was reapproximated  using 2-0 Vicryl.  The subfascial planes were inspected and found to be hemostatic.  The fascia was reapproximated with 0 Vicryl in a running suture.  The subcuticular adipose layer was made hemostatic with Bovie cautery.  The dead space was closed with  plain gut.  Skin was closed with 4-0 Vicryl Rapide on a Keith needle.  Benzoin and Steri-Strips were applied.    The patient tolerated the procedure well.    Sponge, lap and needle counts were correct x2 per the operating staff.  AN/NUANCE  D:08/03/2018 T:08/03/2018 JOB:[id number]

## 2018-08-03 NOTE — H&P (Signed)
Rebecca Rangel is a 23 y.o. female G1P0 at 66 1/7 weeks (EDD 08/16/18 by 8 week [place]) presenting for SROM at 0500am.  Prenatal care significant for breech presentation with plan for a c-section next week. Prenatal care also significant for GDM well- controlled on metformin 500mg  qhs.  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  OB History    Gravida  1   Para      Term      Preterm      AB      Living        SAB      TAB      Ectopic      Multiple      Live Births             Past Medical History:  Diagnosis Date  . Gestational diabetes   . HSV (herpes simplex virus) anogenital infection    Past Surgical History:  Procedure Laterality Date  . NO PAST SURGERIES     Family History: family history includes Heart attack in her mother; Mitral valve prolapse in her father. Social History:  reports that she quit smoking about 7 months ago. She smoked 0.25 packs per day. She has never used smokeless tobacco. She reports current alcohol use. She reports that she does not use drugs.     Maternal Diabetes: Yes:  Diabetes Type:  Insulin/Medication controlled Genetic Screening: Declined Maternal Ultrasounds/Referrals: Normal Fetal Ultrasounds or other Referrals:  None Maternal Substance Abuse:  No Significant Maternal Medications:  Meds include: Other: metformin, valtrex Significant Maternal Lab Results:  Lab values include: Rh negative Other Comments:  Breech  ROS Maternal Medical History:  Reason for admission: Rupture of membranes.   Contractions: Onset was 1-2 hours ago.   Frequency: regular.   Perceived severity is mild.    Fetal activity: Perceived fetal activity is normal.    Prenatal complications: Breech, h/o HSV  Prenatal Complications - Diabetes: gestational. Diabetes is managed by oral agent (monotherapy).        Blood pressure 126/77, pulse (!) 104, temperature 98.5 F (36.9 C), temperature source Oral, resp. rate 18, height 5'  5" (1.651 m), weight 102.2 kg, last menstrual period 11/03/2017. Maternal Exam:  Uterine Assessment: Contraction strength is mild.  Contraction frequency is regular.   Abdomen: Patient reports no abdominal tenderness. Introitus: Normal vulva.   Physical Exam  Constitutional: She appears well-developed.  Cardiovascular: Normal rate and regular rhythm.  Respiratory: Effort normal.  GI: Soft.  Genitourinary:    Vulva and vagina normal.   Neurological: She is alert.  Psychiatric: She has a normal mood and affect.    Prenatal labs: ABO, Rh: O/Negative/-- (10/17 0000) Antibody: Negative (10/17 0000) Rubella: Immune (10/17 0000) RPR: Nonreactive (10/17 0000)  HBsAg: Negative (10/17 0000)  HIV: Non-reactive (10/17 0000)  GBS:   Negative One hour GCT 145 Three hour GTT 88/189/158/60   Assessment/Plan: Pt with SROM and known breech presentation.  Last ate and drank at 0230am.  Will sign out with Dr. Ellyn Hack and likely proceed with c-section after 0830 unless becomes uncomfortable with contractions, currently only mild.     Oliver Pila 08/03/2018, 7:01 AM

## 2018-08-03 NOTE — Brief Op Note (Signed)
08/03/2018  10:20 AM  PATIENT:  Rebecca Rangel  23 y.o. female  PRE-OPERATIVE DIAGNOSIS:  Breech, Rupture of membranes  POST-OPERATIVE DIAGNOSIS:  Breech, rupture of membranes, delivered  PROCEDURE:  Procedure(s) with comments: CESAREAN SECTION (N/A) - RNFA requested  SURGEON:  Surgeon(s) and Role:    * Bovard-Stuckert, Dezarae Mcclaran, MD - Primary   ANESTHESIA:   spinal  EBL:  487 mL IVF and UOP per anesthesia, clear urine at end of procedure  FINDINGS: viable female infant at 9:39am, apgars 8/9, wt P; nl uterus, tubes and ovaries.   BLOOD ADMINISTERED:none  DRAINS: Urinary Catheter (Foley)   LOCAL MEDICATIONS USED:  NONE  SPECIMEN:  Source of Specimen:  Placenta  DISPOSITION OF SPECIMEN:  L&D  COUNTS:  YES  TOURNIQUET:  * No tourniquets in log *  DICTATION: .Other Dictation: Dictation Number [id number]  PLAN OF CARE: Admit to inpatient   PATIENT DISPOSITION:  PACU - hemodynamically stable.   Delay start of Pharmacological VTE agent (>24hrs) due to surgical blood loss or risk of bleeding: not applicable

## 2018-08-03 NOTE — MAU Note (Addendum)
PT SAYS SROM AT 0500- WOKE - FLUID COMING OUT .  [facility] WITH  DR MEISINGER- VE LAST WEEK - CLOSED.  [facility]  FOR C/S NEXT Tuesday ON 5-19-  BREECH .  HAS HX OF HSV- LAST OUTBREAK- NONE DURING PREG.  NO UC'S.

## 2018-08-03 NOTE — Lactation Note (Signed)
This note was copied from a baby's chart. Lactation Consultation Note  Patient Name: Rebecca Rangel Date: 08/03/2018 Reason for consult: Initial assessment;Primapara;1st time breastfeeding;Early term 37-38.6wks;Infant < 6lbs  Baby is 4 hours old  As LC entered the room , baby STS with mom and awake.  LC reviewed hand expressing and asked permission to show her hand expressing and she was receptive,  Able to hand express off 1/5ml and showed mom and dad how to spoon feed and baby tolerated well.  After spoon feeding assisted to latch on the same breast / right/ football with tea hold position and baby latched  With depth and fed for 3 mins / few swallows and released. After feeding for short interval didn't act interested in  Latching. Per mom comfortable with latch .  LC hand expressed mom again and fed the baby a drop of colostrum.  Baby STS with mom and light  Baby blanket on top.  MBURN in the room at the time and is aware to set mom up with a DEBP.  Mom also aware her baby is an early term infant and reasons why she may need to be supplemented.  Mom feeding preference is breast / formula.   LC reviewed the [facility] LC resources after D/C and mom has the handouts.   Mom and dad receptive to teaching.    Maternal Data Has patient been taught Hand Expression?: Yes(several drops off the right breast / and 2 tiny drops off the left side / mom reported limited breast changes with pregnancy ) Does the patient have breastfeeding experience prior to this delivery?: No  Feeding Feeding Type: Breast Fed  LATCH Score Latch: Grasps breast easily, tongue down, lips flanged, rhythmical sucking.  Audible Swallowing: A few with stimulation  Type of Nipple: Flat(compressible areola and latchable )  Comfort (Breast/Nipple): Soft / non-tender  Hold (Positioning): Assistance needed to correctly position infant at breast and maintain latch.  LATCH Score:  7  Interventions Interventions: Breast feeding basics reviewed;Assisted with latch;Skin to skin;Breast massage;Hand express;Breast compression;Adjust position;Support pillows;Position options  Lactation Tools Discussed/Used Tools: Pump(MBU RN aware to set mom up with DEBP ) Breast pump type: Double-Electric Breast Pump   Consult Status Consult Status: Follow-up Date: 08/04/18 Follow-up type: In-patient    Matilde Sprang Mikhael Hendriks 08/03/2018, 2:02 PM

## 2018-08-03 NOTE — Anesthesia Procedure Notes (Signed)
Spinal  Patient location during procedure: OR Staffing Anesthesiologist: Ry Moody E, MD Performed: anesthesiologist  Preanesthetic Checklist Completed: patient identified, surgical consent, pre-op evaluation, timeout performed, IV checked, risks and benefits discussed and monitors and equipment checked Spinal Block Patient position: sitting Prep: site prepped and draped and DuraPrep Patient monitoring: continuous pulse ox, blood pressure and heart rate Approach: midline Location: L3-4 Injection technique: single-shot Needle Needle type: Pencan  Needle gauge: 24 G Needle length: 9 cm Additional Notes Functioning IV was confirmed and monitors were applied. Sterile prep and drape, including hand hygiene and sterile gloves were used. The patient was positioned and the spine was prepped. The skin was anesthetized with lidocaine.  Free flow of clear CSF was obtained prior to injecting local anesthetic into the CSF. The needle was carefully withdrawn. The patient tolerated the procedure well.      

## 2018-08-03 NOTE — MAU Note (Signed)
Notified Dr. Darlin Drop of pt going to OR, she was in the middle of prepping for another c-section and said she would be made aware.

## 2018-08-03 NOTE — Anesthesia Preprocedure Evaluation (Signed)
Anesthesia Evaluation  Patient identified by MRN, date of birth, ID band Patient awake    Reviewed: Allergy & Precautions, H&P , NPO status , Patient's Chart, lab work & pertinent test results  History of Anesthesia Complications Negative for: history of anesthetic complications  Airway Mallampati: II  TM Distance: >3 FB Neck ROM: full    Dental no notable dental hx.    Pulmonary neg pulmonary ROS, former smoker,    Pulmonary exam normal        Cardiovascular negative cardio ROS Normal cardiovascular exam     Neuro/Psych negative neurological ROS  negative psych ROS   GI/Hepatic negative GI ROS, Neg liver ROS,   Endo/Other  diabetes, Gestational  Renal/GU negative Renal ROS  negative genitourinary   Musculoskeletal   Abdominal   Peds  Hematology negative hematology ROS (+)   Anesthesia Other Findings   Reproductive/Obstetrics (+) Pregnancy                             Anesthesia Physical Anesthesia Plan  ASA: II  Anesthesia Plan: Spinal   Post-op Pain Management:    Induction:   PONV Risk Score and Plan: Ondansetron and Treatment may vary due to age or medical condition  Airway Management Planned:   Additional Equipment:   Intra-op Plan:   Post-operative Plan:   Informed Consent: I have reviewed the patients History and Physical, chart, labs and discussed the procedure including the risks, benefits and alternatives for the proposed anesthesia with the patient or authorized representative who has indicated his/her understanding and acceptance.       Plan Discussed with:   Anesthesia Plan Comments:         Anesthesia Quick Evaluation  

## 2018-08-03 NOTE — Anesthesia Postprocedure Evaluation (Signed)
Anesthesia Post Note  Patient: Rebecca Rangel  Procedure(s) Performed: CESAREAN SECTION (N/A )     Patient location during evaluation: PACU Anesthesia Type: Spinal Level of consciousness: oriented and awake and alert Pain management: pain level controlled Vital Signs Assessment: post-procedure vital signs reviewed and stable Respiratory status: spontaneous breathing, respiratory function stable and nonlabored ventilation Cardiovascular status: blood pressure returned to baseline and stable Postop Assessment: no headache, no backache, no apparent nausea or vomiting and spinal receding Anesthetic complications: no    Last Vitals:  Vitals:   08/03/18 1357 08/03/18 1451  BP: 129/74 130/76  Pulse:    Resp: 18 16  Temp: 37 C 36.7 C  SpO2: 98% 97%    Last Pain:  Vitals:   08/03/18 1132  TempSrc: Oral  PainSc: 4    Pain Goal:                   Lucretia Kern

## 2018-08-03 NOTE — Transfer of Care (Signed)
Immediate Anesthesia Transfer of Care Note  Patient: Rebecca Rangel  Procedure(s) Performed: CESAREAN SECTION (N/A )  Patient Location: PACU  Anesthesia Type:Spinal  Level of Consciousness: awake  Airway & Oxygen Therapy: Patient Spontanous Breathing  Post-op Assessment: Report given to RN and Post -op Vital signs reviewed and stable  Post vital signs: stable  Last Vitals:  Vitals Value Taken Time  BP 106/61 08/03/2018 10:22 AM  Temp    Pulse 78 08/03/2018 10:24 AM  Resp 13 08/03/2018 10:24 AM  SpO2 99 % 08/03/2018 10:24 AM  Vitals shown include unvalidated device data.  Last Pain:  Vitals:   08/03/18 0616  TempSrc:   PainSc: 2          Complications: No apparent anesthesia complications

## 2018-08-04 LAB — CBC
HCT: 31.6 % — ABNORMAL LOW (ref 36.0–46.0)
Hemoglobin: 10.8 g/dL — ABNORMAL LOW (ref 12.0–15.0)
MCH: 29.7 pg (ref 26.0–34.0)
MCHC: 34.2 g/dL (ref 30.0–36.0)
MCV: 86.8 fL (ref 80.0–100.0)
Platelets: 204 10*3/uL (ref 150–400)
RBC: 3.64 MIL/uL — ABNORMAL LOW (ref 3.87–5.11)
RDW: 14 % (ref 11.5–15.5)
WBC: 12 10*3/uL — ABNORMAL HIGH (ref 4.0–10.5)
nRBC: 0 % (ref 0.0–0.2)

## 2018-08-04 LAB — BIRTH TISSUE RECOVERY COLLECTION (PLACENTA DONATION)

## 2018-08-04 MED ORDER — RHO D IMMUNE GLOBULIN 1500 UNIT/2ML IJ SOSY
300.0000 ug | PREFILLED_SYRINGE | Freq: Once | INTRAMUSCULAR | Status: AC
  Administered 2018-08-04: 300 ug via INTRAVENOUS
  Filled 2018-08-04: qty 2

## 2018-08-04 NOTE — Progress Notes (Signed)
Subjective: Postpartum Day #1: Cesarean Delivery Patient reports incisional pain, tolerating PO and no problems voiding.    Objective: Vital signs in last 24 hours: Temp:  [97.9 F (36.6 C)-98.6 F (37 C)] 97.9 F (36.6 C) (05/14 0541) Pulse Rate:  [69-89] 89 (05/14 0541) Resp:  [13-25] 18 (05/14 0541) BP: (98-130)/(55-90) 104/56 (05/14 0541) SpO2:  [97 %-100 %] 98 % (05/14 0541)  Physical Exam:  General: alert Lochia: appropriate Uterine Fundus: firm Incision: dressing C/D/I   Recent Labs    08/03/18 0702 08/04/18 0527  HGB 13.3 10.8*  HCT 39.4 31.6*    Assessment/Plan: Status post Cesarean section. Doing well postoperatively.  Continue current care, ambulate.  Leighton Roach Chanya Chrisley 08/04/2018, 8:33 AM

## 2018-08-04 NOTE — Lactation Note (Signed)
This note was copied from a baby's chart. Lactation Consultation Note Baby 17 hrs old. Hasn't BF. Has no interest in BF.  Has been syring fed formula and spoon fed a few drops of colostrum. Mom is using DEBP. Encouraged to pump every 3 hrs. Noted slight smear of colostrum on flange. Encouraged to hand express after pumping. A few drops collected. Milk storage reviewed.  Worked w/mom in trying to latch using #20 NS. Baby bites. Didn't try to suck. #24 NS applied. Baby has to small of a mouth. #20 reapplied. Inserted Similac 22 cal into NS w/curve tip syring. Baby chewed a few times but wouldn't feed. Baby given Similac 22 cal w/slow flow nipple. Milk leaked out when baby suckled when finally sucked. Held baby upright taught pace feeding. Baby spit up undigested formula. Gave baby to mom to hold upright for about 15 min. Before laying baby down in the crib.  Noted mom has facial hair. Mom denies having dx; PCOS but stated it ran in the family. Mom has tubular breast w/flat nipples. Shells given, encouraged to wear in am.  Reviewed w/mom supplementation amount, feeding limit time, STS, strict I&O, and importance of pumping. Mom states understanding.  Patient Name: Rebecca Rangel CRFVO'H Date: 08/04/2018 Reason for consult: Follow-up assessment;Early term 37-38.6wks;Infant < 6lbs;1st time breastfeeding   Maternal Data    Feeding Feeding Type: Formula Nipple Type: Slow - flow  LATCH Score Latch: Too sleepy or reluctant, no latch achieved, no sucking elicited.  Audible Swallowing: None  Type of Nipple: Flat  Comfort (Breast/Nipple): Soft / non-tender  Hold (Positioning): Full assist, staff holds infant at breast  LATCH Score: 3  Interventions Interventions: Breast feeding basics reviewed;Adjust position;DEBP;Assisted with latch;Support pillows;Skin to skin;Position options;Breast massage;Hand express;Pre-pump if needed;Shells;Breast compression  Lactation Tools  Discussed/Used Tools: Shells;Pump;Nipple Shields Nipple shield size: 20 Shell Type: Inverted Breast pump type: Double-Electric Breast Pump   Consult Status Consult Status: Follow-up Date: 08/04/18 Follow-up type: In-patient    Blondell Laperle, Diamond Nickel 08/04/2018, 3:09 AM

## 2018-08-05 ENCOUNTER — Inpatient Hospital Stay (HOSPITAL_COMMUNITY)
Admission: RE | Admit: 2018-08-05 | Discharge: 2018-08-05 | Disposition: A | Discharge status: Home / Self Care | Payer: BLUE CROSS/BLUE SHIELD | Source: Ambulatory Visit

## 2018-08-05 ENCOUNTER — Other Ambulatory Visit (HOSPITAL_COMMUNITY): Admission: RE | Admit: 2018-08-05 | Payer: BLUE CROSS/BLUE SHIELD | Source: Ambulatory Visit

## 2018-08-05 LAB — RH IG WORKUP (INCLUDES ABO/RH)
ABO/RH(D): O NEG
Fetal Screen: NEGATIVE
Gestational Age(Wks): 38
Unit division: 0

## 2018-08-05 NOTE — Lactation Note (Signed)
This note was copied from a baby's chart. Lactation Consultation Note  Patient Name: Rebecca Rangel [id number] Date: 08/05/2018    Baby 49 hours old and sleeping STS on mother's chest. < 6 lbs.  [redacted]w[redacted]d.  Mother states baby has been unable to sustain latch so she is pumping and supplementing.  She prefers to pump for now with single piston.  She received 9 ml with last session. Mother has personal DEBP at home. Encouraged mother to try DEBP again since her supply is increasing and will be less work. Suggest mother call if she would like assistance with latching baby today.     Maternal Data    Feeding Feeding Type: Bottle Fed - Formula Nipple Type: Slow - flow  LATCH Score                   Interventions    Lactation Tools Discussed/Used     Consult Status      Rebecca Rangel 08/05/2018, 11:24 AM

## 2018-08-05 NOTE — Progress Notes (Signed)
Patient ID: Rebecca Rangel, female   DOB: Oct 10, 1995, 23 y.o.   MRN: [id number] POD#2 Pt doing well with no complaints. She is ambulating with no significant pain, voiding well, passing flatus. She denies SOB, CP, nausea. Had a mild headache earlier but attributes to lack of sleep. She is bonding well with baby - pumping well.  VSS GEN - NAD ABD - FF and 4cm below umbilicus, nontender, nondistended EXT - no edema or homans  A/P: POD#2 s/p pltc/s for breech - stable         Routine pp/post op care         Discharge to home likely tomorrow

## 2018-08-06 MED ORDER — IBUPROFEN 600 MG PO TABS: 600.0000 mg | ORAL_TABLET | Freq: Four times a day (QID) | ORAL | 1 refills | Status: DC | PRN

## 2018-08-06 MED ORDER — OXYCODONE HCL 5 MG PO TABS: 5.0000 mg | ORAL_TABLET | ORAL | 0 refills | Status: AC | PRN

## 2018-08-06 NOTE — Lactation Note (Signed)
This note was copied from a baby's chart. Lactation Consultation Note  Patient Name: Rebecca Rangel ASNKN'L Date: 08/06/2018 Reason for consult: Follow-up assessment;Early term 37-38.6wks;Infant < 6lbs;Primapara;1st time breastfeeding;Infant weight loss(5% weight loss / off Bili lights and for D/C today - for now mom pumping and bottle feeding / milk is in ) Baby is 72 hours old  LC reviewed and updated the doc flow sheets per mom and noted the baby was only  Being fed 20 ml per feeding ,  Few 30 ml feeding. LC reviewed whey its important for the calories and volume to increase to at least 30 ml per feeding  By tonight with medium based nipple and progress upward to at least 45 ml per feeding in the next day .  Per mom has been trying the NS / and she latches and doesn't suck. LC recommended instilling her EBM into the  Top of the NS for an appetizer and it will enhance the baby getting into a sucking pattern.  If successful feeding the baby 20 mins / 30 mins  max and supplement with EBM 30 ml . Post  pump both breast and  The next feeding switch to the other breast and do the same.  Per mom has DEBP Medela at home / also Dr. Manson Passey bottle set and Medela . LC mentioned to mom to use the Dr.  Manson Passey instead of the Medela due to the baby being able  to handle the flow.  LC reviewed sore nipple and engorgement prevention and tx.  Mom aware the baby shouldn't go over 3 hours without feeding and to feed with cues.  Mom aware of the breast feeding phone line, the virtual BFSG and the [facility] O/P services.  LC offered offered to request and LC O/P appt and mom receptive and is aware the clinic will call her Monday.    Maternal Data Has patient been taught Hand Expression?: Yes  Feeding Feeding Type: (baby recently fed ) Nipple Type: Slow - flow  LATCH Score                   Interventions Interventions: Breast feeding basics reviewed;Expressed milk;Shells;DEBP  Lactation Tools  Discussed/Used Tools: Pump;Shells Nipple shield size: 20 Breast pump type: Double-Electric Breast Pump WIC Program: No Pump Review: Setup, frequency, and cleaning;Milk Storage(LC reviewed )   Consult Status Consult Status: Follow-up Date: (LC offered mom  to request the clinic to call her to set up an [facility] O/P appt and mom receptive ) Follow-up type: Out-patient(LC placed a request in the Epic basket for the clinic to call mom )    Kathrin Greathouse 08/06/2018, 10:15 AM

## 2018-08-06 NOTE — Discharge Summary (Signed)
OB Discharge Summary     Patient Name: Rebecca Rangel DOB: [birth year] MRN: [id number]  Date of admission: 08/03/2018 Delivering MD: Sherian ReinBOVARD-STUCKERT, JODY   Date of discharge: 08/06/2018  Admitting diagnosis: 38.1WKS WATER BROKE Intrauterine pregnancy: 4272w1d     Secondary diagnosis:  Active Problems:   Breech presentation   S/P cesarean section  Additional problems: none     Discharge diagnosis: Term Pregnancy Delivered                                                                                                Post partum procedures:none  Augmentation: n/a  Complications: None  Hospital course:  Sceduled C/S   23 y.o. yo G1P1001 at 4672w1d was admitted to the hospital 08/03/2018 for scheduled cesarean section with the following indication:Malpresentation.  Membrane Rupture Time/Date: 5:00 AM ,08/03/2018   Patient delivered a Viable infant.08/03/2018  Details of operation can be found in separate operative note.  Pateint had an uncomplicated postpartum course.  She is ambulating, tolerating a regular diet, passing flatus, and urinating well. Patient is discharged home in stable condition on  08/06/18         Physical exam  Vitals:   08/04/18 2206 08/05/18 0614 08/05/18 1500 08/05/18 2207  BP: 121/72 107/68 130/70 121/70  Pulse: 87 72 76 76  Resp: 16 16 16 18   Temp: 98.3 F (36.8 C) 98.6 F (37 C) 98.2 F (36.8 C) 98.2 F (36.8 C)  TempSrc: Oral Oral Oral Axillary  SpO2: 97% 99%    Weight:      Height:       General: alert, cooperative and no distress Lochia: appropriate Uterine Fundus: firm Incision: Dressing is clean, dry, and intact DVT Evaluation: No evidence of DVT seen on physical exam. No significant calf/ankle edema. Labs: Lab Results  Component Value Date   WBC 12.0 (H) 08/04/2018   HGB 10.8 (L) 08/04/2018   HCT 31.6 (L) 08/04/2018   MCV 86.8 08/04/2018   PLT 204 08/04/2018   No flowsheet data found.  Discharge instruction: per After Visit  Summary and "Baby and Me Booklet".  After visit meds:  Allergies as of 08/06/2018   No Known Allergies     Medication List    STOP taking these medications   metFORMIN 500 MG tablet Commonly known as:  GLUCOPHAGE   valACYclovir 500 MG tablet Commonly known as:  VALTREX     TAKE these medications   ibuprofen 600 MG tablet Commonly known as:  ADVIL Take 1 tablet (600 mg total) by mouth every 6 (six) hours as needed for moderate pain or cramping.   oxyCODONE 5 MG immediate release tablet Commonly known as:  Oxy IR/ROXICODONE Take 1 tablet (5 mg total) by mouth every 4 (four) hours as needed for up to 5 days for moderate pain or severe pain.       Diet: routine diet  Activity: Advance as tolerated. Pelvic rest for 6 weeks.   Outpatient follow up:2 week incision check and 6 week postpartum visit Follow up Appt:No future appointments. Follow up Visit:No follow-ups on file.  Postpartum contraception:  Not Discussed  Newborn Data: Live born female  Birth Weight: 5 lb 7 oz (2465 g) APGAR: 8, 9  Newborn Delivery   Birth date/time:  08/03/2018 09:39:00 Delivery type:  C-Section, Low Transverse Trial of labor:  No C-section categorization:  Primary     Baby Feeding: Breast Disposition:home with mother   08/06/2018 Cathrine Muster, DO

## 2018-08-06 NOTE — Progress Notes (Signed)
Patient ID: Rebecca Rangel, female   DOB: 09-27-1995, 23 y.o.   MRN: [id number] Pt doing well with no complaints. Baby was cluster feeding last night so some fatigue as expected. She denies pain, fever, HA, CP or SOB. She is voiding well, tol po, ambulating with no issues. She is still breastfeeding and bonding well with baby. Ready for discharge to home today VSS GEN - NAD  ABD - FF, soft, dressing c/d/i EXT - no edema or homans  A/P: POD#3 s/p c/s for breech -stable         Had GDM - no meds indicated now         Discharge instructions reviewed - 2 week incision check and 6 week pp visit

## 2018-08-06 NOTE — Discharge Instructions (Signed)
Call office with any concerns [phone] 

## 2018-08-09 ENCOUNTER — Inpatient Hospital Stay (HOSPITAL_COMMUNITY)
Admission: RE | Admit: 2018-08-09 | Payer: BLUE CROSS/BLUE SHIELD | Source: Home / Self Care | Admitting: Obstetrics and Gynecology

## 2018-08-09 HISTORY — DX: Anogenital herpesviral infection, unspecified: A60.9

## 2018-08-09 HISTORY — DX: Gestational diabetes mellitus in pregnancy, unspecified control: O24.419

## 2018-08-12 ENCOUNTER — Encounter (HOSPITAL_COMMUNITY): Payer: Self-pay

## 2018-08-12 ENCOUNTER — Inpatient Hospital Stay (HOSPITAL_COMMUNITY)
Admission: AD | Admit: 2018-08-12 | Discharge: 2018-08-13 | Disposition: A | Discharge status: Home / Self Care | Payer: BLUE CROSS/BLUE SHIELD | Source: Ambulatory Visit | Attending: Obstetrics and Gynecology | Admitting: Obstetrics and Gynecology

## 2018-08-12 DIAGNOSIS — Z8632 Personal history of gestational diabetes: Secondary | ICD-10-CM | POA: Insufficient documentation

## 2018-08-12 DIAGNOSIS — Z98891 History of uterine scar from previous surgery: Secondary | ICD-10-CM

## 2018-08-12 DIAGNOSIS — L089 Local infection of the skin and subcutaneous tissue, unspecified: Secondary | ICD-10-CM | POA: Insufficient documentation

## 2018-08-12 MED ORDER — SULFAMETHOXAZOLE-TRIMETHOPRIM 800-160 MG PO TABS: 1.0000 | ORAL_TABLET | Freq: Two times a day (BID) | ORAL | 0 refills | Status: DC

## 2018-08-12 NOTE — MAU Provider Note (Signed)
History     CSN: [id number]  Arrival date and time: 08/12/18 2302   None     Chief Complaint  Patient presents with  . Drainage from Incision   HPI   Rebecca Rangel is 23 y.o. G1P1001 female POD #9 for pLTCS with breech presentation with ROM presenting for concern of infection at incision site due to pustular drainage noted earlier today. Denies foul odor. Has noticed some increased redness of the skin in the area where the drainage occurred. No increased warmth or change in degree of pain. No fevers or nausea/vomiting. Patient is breastfeeding.   OB History    Gravida  1   Para  1   Term  1   Preterm      AB      Living  1     SAB      TAB      Ectopic      Multiple  0   Live Births  1           Past Medical History:  Diagnosis Date  . Gestational diabetes   . HSV (herpes simplex virus) anogenital infection     Past Surgical History:  Procedure Laterality Date  . CESAREAN SECTION N/A 08/03/2018   Procedure: CESAREAN SECTION;  Surgeon: Sherian Rein, MD;  Location: [facility] LD ORS;  Service: Obstetrics;  Laterality: N/A;  RNFA requested  . NO PAST SURGERIES      Family History  Problem Relation Age of Onset  . Heart attack Mother   . Mitral valve prolapse Father     Social History   Tobacco Use  . Smoking status: Former Smoker    Packs/day: 0.25    Last attempt to quit: 12/24/2017    Years since quitting: 0.6  . Smokeless tobacco: Never Used  Substance Use Topics  . Alcohol use: Yes    Comment: socially  . Drug use: No    Allergies: No Known Allergies  Medications Prior to Admission  Medication Sig Dispense Refill Last Dose  . ibuprofen (ADVIL) 600 MG tablet Take 1 tablet (600 mg total) by mouth every 6 (six) hours as needed for moderate pain or cramping. 40 tablet 1     Review of Systems  Constitutional: Negative for chills and fever.  Respiratory: Negative for shortness of breath.   Cardiovascular: Negative for chest pain.   Gastrointestinal: Negative for abdominal pain, nausea and vomiting.  Genitourinary: Negative for dysuria.  Musculoskeletal: Negative for back pain.  Skin: Positive for color change. Negative for rash.  Neurological: Negative for dizziness and light-headedness.   Physical Exam   Blood pressure 118/72, pulse 83, temperature 98.5 F (36.9 C), resp. rate 19, height 5\' 5"  (1.651 m), weight 94 kg, last menstrual period 11/03/2017, unknown if currently breastfeeding.  Physical Exam  Constitutional: She is oriented to person, place, and time. She appears well-developed and well-nourished. No distress.  HENT:  Head: Normocephalic and atraumatic.  Eyes: Conjunctivae and EOM are normal.  Neck: Normal range of motion. Neck supple.  Cardiovascular: Normal rate and regular rhythm.  Respiratory: Effort normal and breath sounds normal. No respiratory distress.  GI: Soft. She exhibits no distension. There is no abdominal tenderness. There is no rebound and no guarding.  Musculoskeletal: Normal range of motion.  Neurological: She is alert and oriented to person, place, and time.  Skin: She is not diaphoretic.  C-section incision with small area (approximately 2 cm) of mostly serosanginous drainage with small amount of pustular  drainage. Inferior aspect of incision where drainage noted is erythematous and slightly edematous. No increased warmth. Incision otherwise appears to be healing well. No TTP.   Psychiatric: She has a normal mood and affect. Her behavior is normal.    MAU Course  Procedures  MDM Exam performed. Vital signs reviewed.   Assessment and Plan   1. Soft tissue infection   2. S/P C-section   3. History of gestational diabetes mellitus (GDM)    Exam of incision mildly concerning for soft tissue wound infection. Vital signs stable, patient well appearing, and no systemic symptoms of infection. Otherwise, incision appears to be healing well. Will prescribe Bactrim for possible soft  tissue infection. [facility] Strict return precautions in the interim reviewed.   De HollingsheadCatherine L  08/12/2018, 11:46 PM

## 2018-08-12 NOTE — Discharge Instructions (Signed)
It appears that you might have a mild skin infection in one area of your incision. I have prescribed Bactrim, which is safe to breast feed with. Take this twice per day for ten days. Follow up with your OB next week. If you develop fevers/nausea/vomiting please return. If the drainage increases, pain increases or you notice spreading redness or worsening of swelling around the incision please return.

## 2018-08-12 NOTE — MAU Note (Signed)
Around 2030 tonight noticed her incision was producing pus.  Uncertain whether there's any foul odor.  Not more painful than usual and hasn't noticed any redness.  Had a primary C/S on 5/13 for breech presentation.

## 2018-08-13 DIAGNOSIS — L089 Local infection of the skin and subcutaneous tissue, unspecified: Secondary | ICD-10-CM | POA: Diagnosis not present

## 2018-08-13 DIAGNOSIS — Z8632 Personal history of gestational diabetes: Secondary | ICD-10-CM | POA: Diagnosis not present

## 2018-08-13 DIAGNOSIS — Z98891 History of uterine scar from previous surgery: Secondary | ICD-10-CM | POA: Diagnosis not present

## 2019-01-03 LAB — OB RESULTS CONSOLE HIV ANTIBODY (ROUTINE TESTING): HIV: NONREACTIVE

## 2019-01-03 LAB — OB RESULTS CONSOLE RUBELLA ANTIBODY, IGM: Rubella: IMMUNE

## 2019-01-03 LAB — OB RESULTS CONSOLE GC/CHLAMYDIA
Chlamydia: NEGATIVE
Gonorrhea: NEGATIVE

## 2019-01-03 LAB — OB RESULTS CONSOLE ANTIBODY SCREEN: Antibody Screen: NEGATIVE

## 2019-01-03 LAB — OB RESULTS CONSOLE ABO/RH: RH Type: NEGATIVE

## 2019-01-03 LAB — OB RESULTS CONSOLE RPR: RPR: NONREACTIVE

## 2019-01-03 LAB — OB RESULTS CONSOLE HEPATITIS B SURFACE ANTIGEN: Hepatitis B Surface Ag: NEGATIVE

## 2019-02-28 ENCOUNTER — Other Ambulatory Visit: Payer: Self-pay

## 2019-02-28 DIAGNOSIS — Z20822 Contact with and (suspected) exposure to covid-19: Secondary | ICD-10-CM

## 2019-03-01 LAB — NOVEL CORONAVIRUS, NAA: SARS-CoV-2, NAA: NOT DETECTED

## 2019-07-19 ENCOUNTER — Encounter (HOSPITAL_COMMUNITY): Payer: Self-pay

## 2019-07-19 ENCOUNTER — Encounter (HOSPITAL_COMMUNITY): Payer: Self-pay | Admitting: *Deleted

## 2019-07-19 ENCOUNTER — Telehealth (HOSPITAL_COMMUNITY): Payer: Self-pay | Admitting: *Deleted

## 2019-07-19 NOTE — Patient Instructions (Signed)
KELISSA MERLIN  07/19/2019   Your procedure is scheduled on:  07/31/2019  Arrive at 0800 at Entrance C on [address] at [facility]  and [employer]. You are invited to use the FREE valet parking or use the Visitor's parking deck.  Pick up the phone at the desk and dial [phone].  Call this number if you have problems the morning of surgery: [phone]  Remember:   Do not eat food:(After Midnight) Desps de medianoche.  Do not drink clear liquids: (After Midnight) Desps de medianoche.  Take these medicines the morning of surgery with A SIP OF WATER:  none   Do not wear jewelry, make-up or nail polish.  Do not wear lotions, powders, or perfumes. Do not wear deodorant.  Do not shave 48 hours prior to surgery.  Do not bring valuables to the hospital.  [facility] is not   responsible for any belongings or valuables brought to the hospital.  Contacts, dentures or bridgework may not be worn into surgery.  Leave suitcase in the car. After surgery it may be brought to your room.  For patients admitted to the hospital, checkout time is 11:00 AM the day of              discharge.      Please read over the following fact sheets that you were given:     Preparing for Surgery

## 2019-07-19 NOTE — Telephone Encounter (Signed)
Preadmission screen  

## 2019-07-29 ENCOUNTER — Other Ambulatory Visit (HOSPITAL_COMMUNITY)
Admission: RE | Admit: 2019-07-29 | Discharge: 2019-07-29 | Disposition: A | Discharge status: Home / Self Care | Payer: BC Managed Care – PPO | Source: Ambulatory Visit | Attending: Obstetrics and Gynecology | Admitting: Obstetrics and Gynecology

## 2019-07-29 ENCOUNTER — Other Ambulatory Visit: Payer: Self-pay

## 2019-07-29 DIAGNOSIS — Z01812 Encounter for preprocedural laboratory examination: Secondary | ICD-10-CM | POA: Insufficient documentation

## 2019-07-29 DIAGNOSIS — Z20822 Contact with and (suspected) exposure to covid-19: Secondary | ICD-10-CM | POA: Insufficient documentation

## 2019-07-29 LAB — TYPE AND SCREEN
ABO/RH(D): O NEG
Antibody Screen: NEGATIVE

## 2019-07-29 LAB — COMPREHENSIVE METABOLIC PANEL
ALT: 10 U/L (ref 0–44)
AST: 14 U/L — ABNORMAL LOW (ref 15–41)
Albumin: 2.8 g/dL — ABNORMAL LOW (ref 3.5–5.0)
Alkaline Phosphatase: 135 U/L — ABNORMAL HIGH (ref 38–126)
Anion gap: 11 (ref 5–15)
BUN: 8 mg/dL (ref 6–20)
CO2: 19 mmol/L — ABNORMAL LOW (ref 22–32)
Calcium: 8.9 mg/dL (ref 8.9–10.3)
Chloride: 106 mmol/L (ref 98–111)
Creatinine, Ser: 0.62 mg/dL (ref 0.44–1.00)
GFR calc Af Amer: >60 mL/min (ref >60)
GFR calc non Af Amer: >60 mL/min (ref >60)
Glucose, Bld: 83 mg/dL (ref 70–99)
Potassium: 3.7 mmol/L (ref 3.5–5.1)
Sodium: 136 mmol/L (ref 135–145)
Total Bilirubin: 0.7 mg/dL (ref 0.3–1.2)
Total Protein: 6.9 g/dL (ref 6.5–8.1)

## 2019-07-29 LAB — CBC
HCT: 38.7 % (ref 36.0–46.0)
Hemoglobin: 12.5 g/dL (ref 12.0–15.0)
MCH: 26.9 pg (ref 26.0–34.0)
MCHC: 32.3 g/dL (ref 30.0–36.0)
MCV: 83.2 fL (ref 80.0–100.0)
Platelets: 261 10*3/uL (ref 150–400)
RBC: 4.65 MIL/uL (ref 3.87–5.11)
RDW: 14.7 % (ref 11.5–15.5)
WBC: 13 10*3/uL — ABNORMAL HIGH (ref 4.0–10.5)
nRBC: 0 % (ref 0.0–0.2)

## 2019-07-29 LAB — SARS CORONAVIRUS 2 (TAT 6-24 HRS): SARS Coronavirus 2: NEGATIVE

## 2019-07-29 LAB — RPR: RPR Ser Ql: NONREACTIVE

## 2019-07-29 NOTE — MAU Note (Signed)
Pt here for covid swab and lab draw prior to section on 5/10.  Pt denies any complaints at this time. Instructions reviewed, pt verbalized understanding.

## 2019-07-30 NOTE — H&P (Signed)
Rebecca Rangel is a 24 y.o. female, G2 P1001, EGA [redacted] weeks with EDC 5-17 presenting for repeat c-section.  Previous c-section for breech, CTOL but declines VBAC.  Pregnancy complicated by one abnormal value on GTT, h/o HSV without current lesions or symptoms.  OB History    Gravida  2   Para  1   Term  1   Preterm      AB      Living  1     SAB      TAB      Ectopic      Multiple  0   Live Births  1          Past Medical History:  Diagnosis Date  . Gestational diabetes   . HSV (herpes simplex virus) anogenital infection    Past Surgical History:  Procedure Laterality Date  . CESAREAN SECTION N/A 08/03/2018   Procedure: CESAREAN SECTION;  Surgeon: Sherian Rein, MD;  Location: [facility] LD ORS;  Service: Obstetrics;  Laterality: N/A;  RNFA requested  . NO PAST SURGERIES     Family History: family history includes Heart attack in her mother; Mitral valve prolapse in her father. Social History:  reports that she quit smoking about 19 months ago. She smoked 0.25 packs per day. She has never used smokeless tobacco. She reports current alcohol use. She reports that she does not use drugs.     Maternal Diabetes: No Genetic Screening: Declined Maternal Ultrasounds/Referrals: Normal Fetal Ultrasounds or other Referrals:  None Maternal Substance Abuse:  No Significant Maternal Medications:  None Significant Maternal Lab Results:  Group B Strep negative Other Comments:  h/o HSV, elective repeat c-section  Review of Systems  Respiratory: Negative.   Cardiovascular: Negative.    Maternal Medical History:  Fetal activity: Perceived fetal activity is normal.    Prenatal complications: no prenatal complications Prenatal Complications - Diabetes: none.      Last menstrual period 10/31/2018, unknown if currently breastfeeding. Maternal Exam:  Abdomen: Patient reports no abdominal tenderness. Surgical scars: low transverse.   Estimated fetal weight is 7.5 lbs.    Fetal presentation: vertex  Introitus: Normal vulva. Normal vagina.  Amniotic fluid character: not assessed.     Physical Exam  Constitutional: She appears well-developed and well-nourished.  Neck: No thyromegaly present.  Cardiovascular: Normal rate, regular rhythm and normal heart sounds.  No murmur heard. Respiratory: Effort normal and breath sounds normal. No respiratory distress. She has no wheezes.  GI: Soft.  Genitourinary:    Vulva normal.   Musculoskeletal:     Cervical back: Neck supple.    Prenatal labs: ABO, Rh: --/--/O NEG (05/08 0847) Antibody: NEG (05/08 0847) Rubella: Immune (10/13 0000) RPR: NON REACTIVE (05/08 0847)  HBsAg: Negative (10/13 0000)  HIV: Non-reactive (10/13 0000)  GBS:   neg  Assessment/Plan: IUP at 39 weeks, previous c-section-declines TOL.  Discussed c-section procedure and risks.  Will admit for repeat c-section   Zenaida Niece 07/30/2019, 3:17 PM

## 2019-07-31 ENCOUNTER — Inpatient Hospital Stay (HOSPITAL_COMMUNITY)
Admission: RE | Admit: 2019-07-31 | Discharge: 2019-08-02 | DRG: 788 | Disposition: A | Discharge status: Home / Self Care | Payer: BC Managed Care – PPO | Attending: Obstetrics and Gynecology | Admitting: Obstetrics and Gynecology

## 2019-07-31 ENCOUNTER — Encounter (HOSPITAL_COMMUNITY): Payer: Self-pay | Admitting: Obstetrics and Gynecology

## 2019-07-31 ENCOUNTER — Inpatient Hospital Stay (HOSPITAL_COMMUNITY): Payer: BC Managed Care – PPO | Admitting: Anesthesiology

## 2019-07-31 ENCOUNTER — Other Ambulatory Visit: Payer: Self-pay

## 2019-07-31 ENCOUNTER — Encounter (HOSPITAL_COMMUNITY)
Admission: RE | Disposition: A | Discharge status: Home / Self Care | Payer: Self-pay | Source: Home / Self Care | Attending: Obstetrics and Gynecology

## 2019-07-31 DIAGNOSIS — Z23 Encounter for immunization: Secondary | ICD-10-CM

## 2019-07-31 DIAGNOSIS — Z3A39 39 weeks gestation of pregnancy: Secondary | ICD-10-CM

## 2019-07-31 DIAGNOSIS — O99214 Obesity complicating childbirth: Secondary | ICD-10-CM | POA: Diagnosis present

## 2019-07-31 DIAGNOSIS — O34211 Maternal care for low transverse scar from previous cesarean delivery: Principal | ICD-10-CM | POA: Diagnosis present

## 2019-07-31 DIAGNOSIS — Z20822 Contact with and (suspected) exposure to covid-19: Secondary | ICD-10-CM | POA: Diagnosis present

## 2019-07-31 DIAGNOSIS — Z87891 Personal history of nicotine dependence: Secondary | ICD-10-CM | POA: Diagnosis not present

## 2019-07-31 SURGERY — Surgical Case
Anesthesia: Spinal | Site: Abdomen | Wound class: Clean Contaminated

## 2019-07-31 MED ORDER — METHYLERGONOVINE MALEATE 0.2 MG/ML IJ SOLN: 0.2000 mg | INTRAMUSCULAR | Status: DC | PRN

## 2019-07-31 MED ORDER — KETOROLAC TROMETHAMINE 30 MG/ML IJ SOLN: 30.0000 mg | Freq: Four times a day (QID) | INTRAMUSCULAR | Status: AC | PRN

## 2019-07-31 MED ORDER — PHENYLEPHRINE HCL-NACL 20-0.9 MG/250ML-% IV SOLN
INTRAVENOUS | Status: AC
  Filled 2019-07-31: qty 250

## 2019-07-31 MED ORDER — DEXAMETHASONE SODIUM PHOSPHATE 4 MG/ML IJ SOLN
INTRAMUSCULAR | Status: DC | PRN
  Administered 2019-07-31: 8 mg via INTRAVENOUS

## 2019-07-31 MED ORDER — SOD CITRATE-CITRIC ACID 500-334 MG/5ML PO SOLN
ORAL | Status: AC
  Filled 2019-07-31: qty 30

## 2019-07-31 MED ORDER — WITCH HAZEL-GLYCERIN EX PADS: 1.0000 "application " | MEDICATED_PAD | CUTANEOUS | Status: DC | PRN

## 2019-07-31 MED ORDER — LACTATED RINGERS IV SOLN: INTRAVENOUS | Status: DC

## 2019-07-31 MED ORDER — DIBUCAINE (PERIANAL) 1 % EX OINT: 1.0000 "application " | TOPICAL_OINTMENT | CUTANEOUS | Status: DC | PRN

## 2019-07-31 MED ORDER — MORPHINE SULFATE (PF) 0.5 MG/ML IJ SOLN
INTRAMUSCULAR | Status: DC | PRN
  Administered 2019-07-31: 150 ug via INTRATHECAL

## 2019-07-31 MED ORDER — ZOLPIDEM TARTRATE 5 MG PO TABS: 5.0000 mg | ORAL_TABLET | Freq: Every evening | ORAL | Status: DC | PRN

## 2019-07-31 MED ORDER — DIPHENHYDRAMINE HCL 25 MG PO CAPS: 25.0000 mg | ORAL_CAPSULE | Freq: Four times a day (QID) | ORAL | Status: DC | PRN

## 2019-07-31 MED ORDER — DEXAMETHASONE SODIUM PHOSPHATE 4 MG/ML IJ SOLN
INTRAMUSCULAR | Status: AC
  Filled 2019-07-31: qty 7

## 2019-07-31 MED ORDER — NALBUPHINE HCL 10 MG/ML IJ SOLN: 5.0000 mg | Freq: Once | INTRAMUSCULAR | Status: DC | PRN

## 2019-07-31 MED ORDER — SOD CITRATE-CITRIC ACID 500-334 MG/5ML PO SOLN
30.0000 mL | Freq: Once | ORAL | Status: AC
  Administered 2019-07-31: 30 mL via ORAL

## 2019-07-31 MED ORDER — PRENATAL MULTIVITAMIN CH
1.0000 | ORAL_TABLET | Freq: Every day | ORAL | Status: DC
  Administered 2019-08-01: 14:00:00 1 via ORAL
  Filled 2019-07-31: qty 1

## 2019-07-31 MED ORDER — FENTANYL CITRATE (PF) 100 MCG/2ML IJ SOLN
INTRAMUSCULAR | Status: DC | PRN
  Administered 2019-07-31: 15 ug via INTRATHECAL

## 2019-07-31 MED ORDER — SODIUM CHLORIDE 0.9 % IR SOLN
Status: DC | PRN
  Administered 2019-07-31: 1

## 2019-07-31 MED ORDER — SODIUM CHLORIDE 0.9 % IV SOLN: INTRAVENOUS | Status: DC | PRN

## 2019-07-31 MED ORDER — NALBUPHINE HCL 10 MG/ML IJ SOLN: 5.0000 mg | INTRAMUSCULAR | Status: DC | PRN

## 2019-07-31 MED ORDER — MEPERIDINE HCL 25 MG/ML IJ SOLN: 6.2500 mg | INTRAMUSCULAR | Status: DC | PRN

## 2019-07-31 MED ORDER — FENTANYL CITRATE (PF) 100 MCG/2ML IJ SOLN
25.0000 ug | INTRAMUSCULAR | Status: DC | PRN
  Administered 2019-07-31: 50 ug via INTRAVENOUS

## 2019-07-31 MED ORDER — IBUPROFEN 800 MG PO TABS
800.0000 mg | ORAL_TABLET | Freq: Three times a day (TID) | ORAL | Status: DC
  Administered 2019-08-01 – 2019-08-02 (×3): 800 mg via ORAL
  Filled 2019-07-31 (×3): qty 1

## 2019-07-31 MED ORDER — SCOPOLAMINE 1 MG/3DAYS TD PT72
MEDICATED_PATCH | TRANSDERMAL | Status: AC
  Filled 2019-07-31: qty 1

## 2019-07-31 MED ORDER — ACETAMINOPHEN 500 MG PO TABS
1000.0000 mg | ORAL_TABLET | Freq: Four times a day (QID) | ORAL | Status: DC
  Administered 2019-07-31 – 2019-08-02 (×7): 1000 mg via ORAL
  Filled 2019-07-31 (×7): qty 2

## 2019-07-31 MED ORDER — KETOROLAC TROMETHAMINE 30 MG/ML IJ SOLN
30.0000 mg | Freq: Four times a day (QID) | INTRAMUSCULAR | Status: AC
  Administered 2019-07-31 – 2019-08-01 (×2): 30 mg via INTRAVENOUS
  Filled 2019-07-31 (×3): qty 1

## 2019-07-31 MED ORDER — ONDANSETRON HCL 4 MG/2ML IJ SOLN
4.0000 mg | Freq: Once | INTRAMUSCULAR | Status: AC
  Administered 2019-07-31: 4 mg via INTRAVENOUS

## 2019-07-31 MED ORDER — MEASLES, MUMPS & RUBELLA VAC IJ SOLR: 0.5000 mL | Freq: Once | INTRAMUSCULAR | Status: DC

## 2019-07-31 MED ORDER — KETOROLAC TROMETHAMINE 30 MG/ML IJ SOLN
30.0000 mg | Freq: Four times a day (QID) | INTRAMUSCULAR | Status: AC | PRN
  Administered 2019-07-31 (×2): 30 mg via INTRAVENOUS

## 2019-07-31 MED ORDER — PHENYLEPHRINE HCL-NACL 20-0.9 MG/250ML-% IV SOLN
INTRAVENOUS | Status: DC | PRN
  Administered 2019-07-31: 60 ug/min via INTRAVENOUS

## 2019-07-31 MED ORDER — OXYTOCIN 40 UNITS IN NORMAL SALINE INFUSION - SIMPLE MED
INTRAVENOUS | Status: AC
  Filled 2019-07-31: qty 1000

## 2019-07-31 MED ORDER — ONDANSETRON HCL 4 MG/2ML IJ SOLN: 4.0000 mg | Freq: Three times a day (TID) | INTRAMUSCULAR | Status: DC | PRN

## 2019-07-31 MED ORDER — SCOPOLAMINE 1 MG/3DAYS TD PT72
1.0000 | MEDICATED_PATCH | Freq: Once | TRANSDERMAL | Status: DC
  Administered 2019-07-31: 1.5 mg via TRANSDERMAL

## 2019-07-31 MED ORDER — FENTANYL CITRATE (PF) 100 MCG/2ML IJ SOLN
INTRAMUSCULAR | Status: AC
  Filled 2019-07-31: qty 2

## 2019-07-31 MED ORDER — ONDANSETRON HCL 4 MG/2ML IJ SOLN
INTRAMUSCULAR | Status: AC
  Filled 2019-07-31: qty 2

## 2019-07-31 MED ORDER — LACTATED RINGERS IV SOLN: INTRAVENOUS | Status: DC | PRN

## 2019-07-31 MED ORDER — OXYCODONE HCL 5 MG PO TABS: 5.0000 mg | ORAL_TABLET | ORAL | Status: DC | PRN

## 2019-07-31 MED ORDER — STERILE WATER FOR IRRIGATION IR SOLN
Status: DC | PRN
  Administered 2019-07-31: 1

## 2019-07-31 MED ORDER — METOCLOPRAMIDE HCL 5 MG/ML IJ SOLN
INTRAMUSCULAR | Status: AC
  Filled 2019-07-31: qty 2

## 2019-07-31 MED ORDER — MORPHINE SULFATE (PF) 0.5 MG/ML IJ SOLN
INTRAMUSCULAR | Status: AC
  Filled 2019-07-31: qty 10

## 2019-07-31 MED ORDER — NALOXONE HCL 0.4 MG/ML IJ SOLN: 0.4000 mg | INTRAMUSCULAR | Status: DC | PRN

## 2019-07-31 MED ORDER — OXYTOCIN 40 UNITS IN NORMAL SALINE INFUSION - SIMPLE MED: 2.5000 [IU]/h | INTRAVENOUS | Status: AC

## 2019-07-31 MED ORDER — PHENYLEPHRINE 40 MCG/ML (10ML) SYRINGE FOR IV PUSH (FOR BLOOD PRESSURE SUPPORT)
PREFILLED_SYRINGE | INTRAVENOUS | Status: AC
  Filled 2019-07-31: qty 10

## 2019-07-31 MED ORDER — OXYCODONE HCL 5 MG/5ML PO SOLN: 5.0000 mg | Freq: Once | ORAL | Status: DC | PRN

## 2019-07-31 MED ORDER — SENNOSIDES-DOCUSATE SODIUM 8.6-50 MG PO TABS
2.0000 | ORAL_TABLET | ORAL | Status: DC
  Administered 2019-07-31 – 2019-08-02 (×2): 2 via ORAL
  Filled 2019-07-31 (×2): qty 2

## 2019-07-31 MED ORDER — FAMOTIDINE 20 MG PO TABS
ORAL_TABLET | ORAL | Status: AC
  Filled 2019-07-31: qty 1

## 2019-07-31 MED ORDER — KETOROLAC TROMETHAMINE 30 MG/ML IJ SOLN
INTRAMUSCULAR | Status: AC
  Filled 2019-07-31: qty 1

## 2019-07-31 MED ORDER — FAMOTIDINE 20 MG PO TABS
20.0000 mg | ORAL_TABLET | Freq: Once | ORAL | Status: AC
  Administered 2019-07-31: 20 mg via ORAL

## 2019-07-31 MED ORDER — DIPHENHYDRAMINE HCL 50 MG/ML IJ SOLN: 12.5000 mg | INTRAMUSCULAR | Status: DC | PRN

## 2019-07-31 MED ORDER — OXYCODONE HCL 5 MG PO TABS: 5.0000 mg | ORAL_TABLET | Freq: Once | ORAL | Status: DC | PRN

## 2019-07-31 MED ORDER — CEFAZOLIN SODIUM-DEXTROSE 2-4 GM/100ML-% IV SOLN
INTRAVENOUS | Status: AC
  Filled 2019-07-31: qty 100

## 2019-07-31 MED ORDER — CEFAZOLIN SODIUM-DEXTROSE 2-4 GM/100ML-% IV SOLN: 2.0000 g | INTRAVENOUS | Status: DC

## 2019-07-31 MED ORDER — METHYLERGONOVINE MALEATE 0.2 MG PO TABS: 0.2000 mg | ORAL_TABLET | ORAL | Status: DC | PRN

## 2019-07-31 MED ORDER — PROMETHAZINE HCL 25 MG/ML IJ SOLN: 6.2500 mg | INTRAMUSCULAR | Status: DC | PRN

## 2019-07-31 MED ORDER — NALOXONE HCL 4 MG/10ML IJ SOLN
1.0000 ug/kg/h | INTRAVENOUS | Status: DC | PRN
  Filled 2019-07-31: qty 5

## 2019-07-31 MED ORDER — MENTHOL 3 MG MT LOZG: 1.0000 | LOZENGE | OROMUCOSAL | Status: DC | PRN

## 2019-07-31 MED ORDER — OXYTOCIN 10 UNIT/ML IJ SOLN
INTRAMUSCULAR | Status: DC | PRN
  Administered 2019-07-31: 40 [IU]

## 2019-07-31 MED ORDER — DEXAMETHASONE SODIUM PHOSPHATE 4 MG/ML IJ SOLN
INTRAMUSCULAR | Status: AC
  Filled 2019-07-31: qty 1

## 2019-07-31 MED ORDER — TETANUS-DIPHTH-ACELL PERTUSSIS 5-2.5-18.5 LF-MCG/0.5 IM SUSP: 0.5000 mL | Freq: Once | INTRAMUSCULAR | Status: DC

## 2019-07-31 MED ORDER — BUPIVACAINE IN DEXTROSE 0.75-8.25 % IT SOLN
INTRATHECAL | Status: DC | PRN
  Administered 2019-07-31: 1.6 mL via INTRATHECAL

## 2019-07-31 MED ORDER — SIMETHICONE 80 MG PO CHEW: 80.0000 mg | CHEWABLE_TABLET | ORAL | Status: DC | PRN

## 2019-07-31 MED ORDER — COCONUT OIL OIL: 1.0000 "application " | TOPICAL_OIL | Status: DC | PRN

## 2019-07-31 MED ORDER — DIPHENHYDRAMINE HCL 25 MG PO CAPS: 25.0000 mg | ORAL_CAPSULE | ORAL | Status: DC | PRN

## 2019-07-31 MED ORDER — CEFAZOLIN SODIUM-DEXTROSE 2-3 GM-%(50ML) IV SOLR
INTRAVENOUS | Status: DC | PRN
  Administered 2019-07-31: 2 g via INTRAVENOUS

## 2019-07-31 MED ORDER — SODIUM CHLORIDE 0.9% FLUSH: 3.0000 mL | INTRAVENOUS | Status: DC | PRN

## 2019-07-31 MED ORDER — MAGNESIUM HYDROXIDE 400 MG/5ML PO SUSP: 30.0000 mL | ORAL | Status: DC | PRN

## 2019-07-31 SURGICAL SUPPLY — 33 items
BENZOIN TINCTURE PRP APPL 2/3 (GAUZE/BANDAGES/DRESSINGS) ×2 IMPLANT
CHLORAPREP W/TINT 26ML (MISCELLANEOUS) ×3 IMPLANT
CLAMP CORD UMBIL (MISCELLANEOUS) IMPLANT
CLOSURE STERI STRIP 1/2 X4 (GAUZE/BANDAGES/DRESSINGS) ×2 IMPLANT
CLOTH BEACON ORANGE TIMEOUT ST (SAFETY) ×3 IMPLANT
DRSG OPSITE POSTOP 4X10 (GAUZE/BANDAGES/DRESSINGS) ×3 IMPLANT
ELECT REM PT RETURN 9FT ADLT (ELECTROSURGICAL) ×3
ELECTRODE REM PT RTRN 9FT ADLT (ELECTROSURGICAL) ×1 IMPLANT
EXTRACTOR VACUUM KIWI (MISCELLANEOUS) IMPLANT
EXTRACTOR VACUUM M CUP 4 TUBE (SUCTIONS) IMPLANT
EXTRACTOR VACUUM M CUP 4' TUBE (SUCTIONS)
GLOVE BIOGEL PI IND STRL 7.0 (GLOVE) ×1 IMPLANT
GLOVE BIOGEL PI INDICATOR 7.0 (GLOVE) ×2
GLOVE ORTHO TXT STRL SZ7.5 (GLOVE) ×3 IMPLANT
GOWN STRL REUS W/TWL LRG LVL3 (GOWN DISPOSABLE) ×6 IMPLANT
KIT ABG SYR 3ML LUER SLIP (SYRINGE) IMPLANT
NDL HYPO 25X5/8 SAFETYGLIDE (NEEDLE) ×1 IMPLANT
NEEDLE HYPO 25X5/8 SAFETYGLIDE (NEEDLE) ×3 IMPLANT
NS IRRIG 1000ML POUR BTL (IV SOLUTION) ×3 IMPLANT
PACK C SECTION WH (CUSTOM PROCEDURE TRAY) ×3 IMPLANT
PAD OB MATERNITY 4.3X12.25 (PERSONAL CARE ITEMS) ×3 IMPLANT
PENCIL SMOKE EVAC W/HOLSTER (ELECTROSURGICAL) ×3 IMPLANT
RTRCTR C-SECT PINK 25CM LRG (MISCELLANEOUS) ×3 IMPLANT
SUT CHROMIC 1 CTX 36 (SUTURE) ×6 IMPLANT
SUT PLAIN 0 NONE (SUTURE) IMPLANT
SUT PLAIN 2 0 XLH (SUTURE) ×2 IMPLANT
SUT VIC AB 0 CT1 27 (SUTURE) ×4
SUT VIC AB 0 CT1 27XBRD ANBCTR (SUTURE) ×2 IMPLANT
SUT VIC AB 2-0 CT1 (SUTURE) ×3 IMPLANT
SUT VIC AB 4-0 KS 27 (SUTURE) IMPLANT
TOWEL OR 17X24 6PK STRL BLUE (TOWEL DISPOSABLE) ×3 IMPLANT
TRAY FOLEY W/BAG SLVR 14FR LF (SET/KITS/TRAYS/PACK) ×3 IMPLANT
WATER STERILE IRR 1000ML POUR (IV SOLUTION) ×3 IMPLANT

## 2019-07-31 NOTE — Op Note (Signed)
Preoperative diagnosis: Intrauterine pregnancy at 39 weeks, previous cesarean section Postoperative diagnosis: Same Procedure: Repeat low transverse cesarean section without extensions Surgeon: Lavina Hamman M.D. Assistant:  Genice Rouge, RNFA Anesthesia: Spinal  Findings: Patient had normal gravid anatomy and delivered a viable female infant with Apgars of 9 and 9 weight pending.  Thin LUS on left Estimated blood loss: 200 cc Specimens: Placenta sent to labor and delivery Complications: None  Procedure in detail: The patient was taken to the operating room and placed in the sitting position. The anesthesiologist instilled spinal anesthesia.  She was then placed in the dorsosupine position with left tilt. Abdomen was then prepped and draped in the usual sterile fashion, and a foley catheter was inserted. The level of her anesthesia was found to be adequate. Abdomen was entered via a standard Pfannenstiel incision through her previous scar.  Some omental adhesions were taken down with Bovie. Once the peritoneal cavity was entered the Alexis disposable self-retaining retractor was placed and good visualization was achieved. A 4 cm transverse incision was then made in the lower uterine segment pushing the bladder inferior, the left side of the LUS was very thin. Once the uterine cavity was entered the incision was extended digitally, clear amniotic fluid. The fetal vertex was grasped and delivered through the incision atraumatically. Mouth and nares were suctioned. The remainder of the infant then delivered atraumatically. Cord was doubly clamped and cut after one minute and the infant handed to the awaiting pediatric team. Cord blood was obtained. The placenta delivered spontaneously. Uterus was wiped dry with clean lap pad and all clots and debris were removed. Uterine incision was inspected and found to be free of extensions. Uterine incision was closed in 1 layer with running locking #1 Chromic. Tubes  and ovaries were inspected and found to be normal. Uterine incision was inspected and found to be hemostatic. Bleeding from serosal edges was controlled with electrocautery. The Alexis retractor was removed. Subfascial space was irrigated and made hemostatic with electrocautery. Peritoneum was closed with 2-0 Vicryl.  Fascia was closed in running fashion starting at both ends and meeting in the middle with 0 Vicryl. Subcutaneous tissue was then irrigated and made hemostatic with electrocautery, then closed with running 2-0 plain gut. Skin was closed with running 4-0 Vicryl subcuticular suture followed by steri-strips and a sterile dressing. Patient tolerated the procedure well and was taken to the recovery in stable condition. Counts were correct x2, she received Ancef 2 g IV at the beginning of the procedure and she had PAS hose on throughout the procedure.

## 2019-07-31 NOTE — Transfer of Care (Signed)
Immediate Anesthesia Transfer of Care Note  Patient: Rebecca Rangel  Procedure(s) Performed: REPEAT CESAREAN SECTION (N/A Abdomen)  Patient Location: PACU  Anesthesia Type:Spinal  Level of Consciousness: awake, alert  and oriented  Airway & Oxygen Therapy: Patient Spontanous Breathing  Post-op Assessment: Report given to RN and Post -op Vital signs reviewed and stable  Post vital signs: Reviewed and stable  Last Vitals:  Vitals Value Taken Time  BP 115/48 07/31/19 1104  Temp    Pulse 70 07/31/19 1107  Resp 19 07/31/19 1107  SpO2 100 % 07/31/19 1107  Vitals shown include unvalidated device data.  Last Pain:  Vitals:   07/31/19 0817  TempSrc: Oral         Complications: No apparent anesthesia complications

## 2019-07-31 NOTE — Anesthesia Preprocedure Evaluation (Addendum)
Anesthesia Evaluation  Patient identified by MRN, date of birth, ID band Patient awake    Reviewed: Allergy & Precautions, NPO status , Patient's Chart, lab work & pertinent test results  History of Anesthesia Complications Negative for: history of anesthetic complications  Airway Mallampati: II  TM Distance: >3 FB Neck ROM: Full    Dental  (+) Dental Advisory Given, Loose, Chipped,    Pulmonary former smoker,    Pulmonary exam normal        Cardiovascular negative cardio ROS Normal cardiovascular exam     Neuro/Psych negative neurological ROS  negative psych ROS   GI/Hepatic Neg liver ROS, GERD  Controlled and Medicated,  Endo/Other  diabetes, GestationalMorbid obesity  Renal/GU negative Renal ROS     Musculoskeletal negative musculoskeletal ROS (+)   Abdominal   Peds  Hematology negative hematology ROS (+)  Plt 261k    Anesthesia Other Findings HSV Covid neg 5/8   Reproductive/Obstetrics (+) Pregnancy                            Anesthesia Physical Anesthesia Plan  ASA: III  Anesthesia Plan: Spinal   Post-op Pain Management:    Induction:   PONV Risk Score and Plan: 2 and Treatment may vary due to age or medical condition, Ondansetron and Scopolamine patch - Pre-op  Airway Management Planned: Natural Airway  Additional Equipment: None  Intra-op Plan:   Post-operative Plan:   Informed Consent: I have reviewed the patients History and Physical, chart, labs and discussed the procedure including the risks, benefits and alternatives for the proposed anesthesia with the patient or authorized representative who has indicated his/her understanding and acceptance.       Plan Discussed with: CRNA and Anesthesiologist  Anesthesia Plan Comments: (Labs reviewed, platelets acceptable. Discussed risks and benefits of spinal, including spinal/epidural hematoma, infection, failed  block, and PDPH. Patient expressed understanding and wished to proceed. )       Anesthesia Quick Evaluation

## 2019-07-31 NOTE — Interval H&P Note (Signed)
History and Physical Interval Note:  07/31/2019 9:32 AM  Rebecca Rangel  has presented today for surgery, with the diagnosis of repeat c-section-elective.  The various methods of treatment have been discussed with the patient and family. After consideration of risks, benefits and other options for treatment, the patient has consented to  Procedure(s) with comments: REPEAT CESAREAN SECTION (N/A) - RNFA Tracey as a surgical intervention.  The patient's history has been reviewed, patient examined, no change in status, stable for surgery.  I have reviewed the patient's chart and labs.  Questions were answered to the patient's satisfaction.     Leighton Roach Claudie Rathbone

## 2019-07-31 NOTE — Anesthesia Procedure Notes (Signed)
Spinal  Patient location during procedure: OR Start time: 07/31/2019 9:47 AM End time: 07/31/2019 9:50 AM Staffing Performed: anesthesiologist  Anesthesiologist: Beryle Lathe, MD Preanesthetic Checklist Completed: patient identified, IV checked, risks and benefits discussed, surgical consent, monitors and equipment checked, pre-op evaluation and timeout performed Spinal Block Patient position: sitting Prep: DuraPrep Patient monitoring: heart rate, cardiac monitor, continuous pulse ox and blood pressure Approach: midline Location: L3-4 Injection technique: single-shot Needle Needle type: Pencan  Needle gauge: 24 G Additional Notes Consent was obtained prior to the procedure with all questions answered and concerns addressed. Risks including, but not limited to, bleeding, infection, nerve damage, paralysis, failed block, inadequate analgesia, allergic reaction, high spinal, itching, and headache were discussed and the patient wished to proceed. Functioning IV was confirmed and monitors were applied. Sterile prep and drape, including hand hygiene, mask, and sterile gloves were used. The patient was positioned and the spine was prepped. The skin was anesthetized with lidocaine. Free flow of clear CSF was obtained prior to injecting local anesthetic into the CSF. The spinal needle aspirated freely following injection. The needle was carefully withdrawn. The patient tolerated the procedure well.   Leslye Peer, MD

## 2019-07-31 NOTE — Anesthesia Postprocedure Evaluation (Signed)
Anesthesia Post Note  Patient: Rebecca Rangel  Procedure(s) Performed: REPEAT CESAREAN SECTION (N/A Abdomen)     Patient location during evaluation: PACU Anesthesia Type: Spinal Level of consciousness: awake and alert Pain management: pain level controlled Vital Signs Assessment: post-procedure vital signs reviewed and stable Respiratory status: spontaneous breathing and respiratory function stable Cardiovascular status: blood pressure returned to baseline and stable Postop Assessment: spinal receding and no apparent nausea or vomiting Anesthetic complications: no    Last Vitals:  Vitals:   07/31/19 1200 07/31/19 1216  BP: 109/63 108/60  Pulse: 67 66  Resp: 13 15  Temp: 37.1 C   SpO2: 99% 100%    Last Pain:  Vitals:   07/31/19 1200  TempSrc: Oral  PainSc: 3    Pain Goal:                   Beryle Lathe

## 2019-08-01 LAB — CBC
HCT: 31.5 % — ABNORMAL LOW (ref 36.0–46.0)
Hemoglobin: 10 g/dL — ABNORMAL LOW (ref 12.0–15.0)
MCH: 26.9 pg (ref 26.0–34.0)
MCHC: 31.7 g/dL (ref 30.0–36.0)
MCV: 84.7 fL (ref 80.0–100.0)
Platelets: 251 10*3/uL (ref 150–400)
RBC: 3.72 MIL/uL — ABNORMAL LOW (ref 3.87–5.11)
RDW: 14.6 % (ref 11.5–15.5)
WBC: 17 10*3/uL — ABNORMAL HIGH (ref 4.0–10.5)
nRBC: 0 % (ref 0.0–0.2)

## 2019-08-01 LAB — BIRTH TISSUE RECOVERY COLLECTION (PLACENTA DONATION)

## 2019-08-01 MED ORDER — RHO D IMMUNE GLOBULIN 1500 UNIT/2ML IJ SOSY
300.0000 ug | PREFILLED_SYRINGE | Freq: Once | INTRAMUSCULAR | Status: AC
  Administered 2019-08-01: 18:00:00 300 ug via INTRAVENOUS
  Filled 2019-08-01: qty 2

## 2019-08-01 NOTE — Progress Notes (Signed)
Subjective: Postpartum Day #1: Cesarean Delivery Patient reports incisional pain, tolerating PO and no problems voiding.    Objective: Vital signs in last 24 hours: Temp:  [97.7 F (36.5 C)-98.7 F (37.1 C)] 97.7 F (36.5 C) (05/11 0510) Pulse Rate:  [60-77] 60 (05/11 0510) Resp:  [13-20] 18 (05/11 0510) BP: (108-127)/(48-65) 111/65 (05/11 0510) SpO2:  [97 %-100 %] 99 % (05/11 0510)  Physical Exam:  General: alert Lochia: appropriate Uterine Fundus: firm Incision: dressing C/D/I   Recent Labs    07/29/19 0847 08/01/19 0619  HGB 12.5 10.0*  HCT 38.7 31.5*    Assessment/Plan: Status post Cesarean section. Doing well postoperatively.  Continue current care, ambulate.  Rebecca Rangel 08/01/2019, 8:27 AM

## 2019-08-02 LAB — RH IG WORKUP (INCLUDES ABO/RH)
ABO/RH(D): O NEG
Fetal Screen: NEGATIVE
Gestational Age(Wks): 39
Unit division: 0

## 2019-08-02 MED ORDER — IBUPROFEN 800 MG PO TABS: 800.0000 mg | ORAL_TABLET | Freq: Three times a day (TID) | ORAL | 0 refills | Status: AC

## 2019-08-02 MED ORDER — ACETAMINOPHEN 500 MG PO TABS: 1000.0000 mg | ORAL_TABLET | Freq: Four times a day (QID) | ORAL | 0 refills | Status: AC

## 2019-08-02 NOTE — Progress Notes (Signed)
Subjective: Postpartum Day 2: Cesarean Delivery Patient reports tolerating PO, + flatus and no problems voidingAmbulating well and ready for d/c home.  Objective: Vital signs in last 24 hours: Temp:  [97.5 F (36.4 C)-98.5 F (36.9 C)] 98.5 F (36.9 C) (05/12 0545) Pulse Rate:  [70-81] 81 (05/12 0545) Resp:  [16] 16 (05/11 2129) BP: (115-136)/(61-72) 136/72 (05/12 0545) SpO2:  [98 %-99 %] 98 % (05/12 0545)  Physical Exam:  General: alert and cooperative Lochia: appropriate Uterine Fundus: firm Incision: C/D/I   Recent Labs    08/01/19 0619  HGB 10.0*  HCT 31.5*    Assessment/Plan: Status post Cesarean section. Doing well postoperatively.  D/c home with f/u in 2 weeks for incision check.  Reviewed incision care  Oliver Pila 08/02/2019, 9:26 AM

## 2019-08-02 NOTE — Discharge Summary (Signed)
Postpartum Discharge Summary       Patient Name: Rebecca Rangel DOB: 29-Dec-1995 MRN: [id number]  Date of admission: 07/31/2019 Delivery date:07/31/2019  Delivering provider: Jackelyn Knife, TODD  Date of discharge: 08/02/2019  Admitting diagnosis: Maternal care due to low transverse uterine scar from previous cesarean delivery [O34.211] Intrauterine pregnancy: [redacted]w[redacted]d     Secondary diagnosis:  Active Problems:   Maternal care due to low transverse uterine scar from previous cesarean delivery  Additional problems: none    Discharge diagnosis: Term Pregnancy Delivered                                              Post partum procedures:rhogam  Complications: None  Hospital course: Sceduled C/S   24 y.o. yo G2P2002 at [redacted]w[redacted]d was admitted to the hospital 07/31/2019 for scheduled cesarean section with the following indication:Elective Repeat.Delivery details are as follows:  Membrane Rupture Time/Date: 10:17 AM ,07/31/2019   Delivery Method:C-Section, Low Transverse  Details of operation can be found in separate operative note.  Patient had an uncomplicated postpartum course.  She is ambulating, tolerating a regular diet, passing flatus, and urinating well. Patient is discharged home in stable condition on  08/02/19        Newborn Data: Birth date:07/31/2019  Birth time:10:18 AM  Gender:Female  Living status:Living  Apgars:9 ,9     Magnesium Sulfate received: No BMZ received: No Rhophylac:Yes   Physical exam  Vitals:   07/31/19 2105 08/01/19 0510 08/01/19 2129 08/02/19 0545  BP: (!) 119/53 111/65 115/61 136/72  Pulse: 65 60 70 81  Resp: 18 18 16    Temp: 97.8 F (36.6 C) 97.7 F (36.5 C) (!) 97.5 F (36.4 C) 98.5 F (36.9 C)  TempSrc: Oral Oral Oral Oral  SpO2: 97% 99% 99% 98%  Weight:      Height:       General: alert and cooperative Lochia: appropriate Uterine Fundus: firm Incision: Dressing is clean, dry, and intact DVT Evaluation: No evidence of DVT seen on physical  exam. Labs: Lab Results  Component Value Date   WBC 17.0 (H) 08/01/2019   HGB 10.0 (L) 08/01/2019   HCT 31.5 (L) 08/01/2019   MCV 84.7 08/01/2019   PLT 251 08/01/2019   CMP Latest Ref Rng & Units 07/29/2019  Glucose 70 - 99 mg/dL 83  BUN 6 - 20 mg/dL 8  Creatinine 09/28/2019 - 5.88 mg/dL 5.02  Sodium 7.74 - 128 mmol/L 136  Potassium 3.5 - 5.1 mmol/L 3.7  Chloride 98 - 111 mmol/L 106  CO2 22 - 32 mmol/L 19(L)  Calcium 8.9 - 10.3 mg/dL 8.9  Total Protein 6.5 - 8.1 g/dL 6.9  Total Bilirubin 0.3 - 1.2 mg/dL 0.7  Alkaline Phos 38 - 126 U/L 135(H)  AST 15 - 41 U/L 14(L)  ALT 0 - 44 U/L 10   Edinburgh Score: Edinburgh Postnatal Depression Scale Screening Tool 07/31/2019  I have been able to laugh and see the funny side of things. 0  I have looked forward with enjoyment to things. 0  I have blamed myself unnecessarily when things went wrong. 0  I have been anxious or worried for no good reason. 0  I have felt scared or panicky for no good reason. 0  Things have been getting on top of me. 0  I have been so unhappy that I have had  difficulty sleeping. 0  I have felt sad or miserable. 0  I have been so unhappy that I have been crying. 0  The thought of harming myself has occurred to me. 0  Edinburgh Postnatal Depression Scale Total 0      After visit meds:  Allergies as of 08/02/2019   No Known Allergies     Medication List    TAKE these medications   acetaminophen 500 MG tablet Commonly known as: TYLENOL Take 2 tablets (1,000 mg total) by mouth every 6 (six) hours. What changed:   when to take this  reasons to take this   ibuprofen 800 MG tablet Commonly known as: ADVIL Take 1 tablet (800 mg total) by mouth every 8 (eight) hours.   PRENATAL PO Take 2 capsules by mouth daily.        Discharge home in stable condition Infant Feeding: Breast Infant Disposition:home with mother Discharge instruction: per After Visit Summary and Postpartum booklet. Activity: Advance as  tolerated. Pelvic rest for 6 weeks.  Diet: routine diet Anticipated Birth Control: POPs or IUD Postpartum Appointment:2 weeks Additional Postpartum F/U: Incision check 2 weeks Future Appointments:No future appointments. Follow up Visit: Follow-up Information    Meisinger, Todd, MD. Schedule an appointment as soon as possible for a visit in 2 week(s).   Specialty: Obstetrics and Gynecology Why: Incision check Contact information: [address] [place] [address] [phone]               08/02/2019 Logan Bores, MD

## 2020-06-17 ENCOUNTER — Ambulatory Visit: Payer: Self-pay | Admitting: Medical
